# Patient Record
Sex: Male | Born: 1970
Health system: Southern US, Community
[De-identification: ages and names within clinical notes are randomized; demographics above are authoritative.]

## PROBLEM LIST (undated history)

## (undated) DIAGNOSIS — T7840XA Allergy, unspecified, initial encounter: Secondary | ICD-10-CM

## (undated) DIAGNOSIS — H9190 Unspecified hearing loss, unspecified ear: Secondary | ICD-10-CM

## (undated) HISTORY — DX: Allergy, unspecified, initial encounter: T78.40XA

## (undated) HISTORY — DX: Unspecified hearing loss, unspecified ear: H91.90

## (undated) HISTORY — PX: ROTATOR CUFF REPAIR: SHX139

---

## 2003-06-23 ENCOUNTER — Ambulatory Visit (HOSPITAL_BASED_OUTPATIENT_CLINIC_OR_DEPARTMENT_OTHER): Admission: RE | Admit: 2003-06-23 | Discharge: 2003-06-23 | Payer: Self-pay | Admitting: Orthopedic Surgery

## 2017-10-02 ENCOUNTER — Ambulatory Visit (INDEPENDENT_AMBULATORY_CARE_PROVIDER_SITE_OTHER): Payer: 59 | Admitting: Family Medicine

## 2017-10-02 ENCOUNTER — Encounter: Payer: Self-pay | Admitting: Family Medicine

## 2017-10-02 VITALS — BP 128/82 | HR 82 | Temp 97.2°F | Ht 72.0 in | Wt 206.0 lb

## 2017-10-02 DIAGNOSIS — R1030 Lower abdominal pain, unspecified: Secondary | ICD-10-CM

## 2017-10-02 LAB — MICROSCOPIC EXAMINATION
BACTERIA UA: NONE SEEN
EPITHELIAL CELLS (NON RENAL): NONE SEEN /HPF (ref 0–10)
RBC MICROSCOPIC, UA: NONE SEEN /HPF (ref 0–?)
Renal Epithel, UA: NONE SEEN /hpf

## 2017-10-02 LAB — URINALYSIS, COMPLETE
Bilirubin, UA: NEGATIVE
GLUCOSE, UA: NEGATIVE
KETONES UA: NEGATIVE
LEUKOCYTES UA: NEGATIVE
NITRITE UA: NEGATIVE
PROTEIN UA: NEGATIVE
RBC, UA: NEGATIVE
SPEC GRAV UA: 1.02 (ref 1.005–1.030)
Urobilinogen, Ur: 0.2 mg/dL (ref 0.2–1.0)
pH, UA: 6 (ref 5.0–7.5)

## 2017-10-02 NOTE — Progress Notes (Signed)
BP 128/82   Pulse 82   Temp (!) 97.2 F (36.2 C) (Oral)   Ht 6' (1.829 m)   Wt 206 lb (93.4 kg)   BMI 27.94 kg/m    Subjective:    Patient ID: Wesley Baxter, male    DOB: Jun 09, 1971, 47 y.o.   MRN: [id number]  HPI: Wesley Baxter is a 47 y.o. male presenting on 10/02/2017 for Establish Care (has not been to doctor in years; gets a "physical" here for his employer)   HPI Lower abdominal pain and groin pain Patient describes this lower abdominal pressure/pain.  He says he will get a sharp shooting pain down more towards his rectum.  And we will also get a tightness or pressure sensation in his scrotum.  He cannot pinpoint that one side is more than the other.  He denies any urinary or bowel issues or blood in his stool or urine.  He denies any burning with urination or pain with bowel movements.  He feels like the pressure sensation but will make him feel constipated but he has not been constipated and has bowel movements every day and does not have to strain or push to get them out.  He says that he feels this pain in brief moments that will arise when he is lifting something over 25 pounds and shifting to the side while he is lifting.  He says if he just lifts and walks straight ahead he does not feel it.  He does not feel it when he squats down or moves or does any other movements except lifting and stepping towards the side.  He feels like this is been going on for about 4 months and the most recent occurrence was a couple days ago.  Relevant past medical, surgical, family and social history reviewed and updated as indicated. Interim medical history since our last visit reviewed. Allergies and medications reviewed and updated.  Review of Systems  Constitutional: Negative for chills and fever.  Respiratory: Negative for shortness of breath and wheezing.   Cardiovascular: Negative for chest pain and leg swelling.  Gastrointestinal: Positive for abdominal pain. Negative for  abdominal distention, blood in stool, constipation, diarrhea, nausea and vomiting.  Genitourinary: Positive for testicular pain. Negative for difficulty urinating, discharge, flank pain, frequency, hematuria, penile pain, penile swelling and scrotal swelling.  Musculoskeletal: Positive for myalgias. Negative for back pain and gait problem.  Skin: Negative for color change and rash.  Neurological: Negative for dizziness, weakness, light-headedness and numbness.  All other systems reviewed and are negative.   Per HPI unless specifically indicated above  Social History   Socioeconomic History  . Marital status: Married    Spouse name: Not on file  . Number of children: Not on file  . Years of education: Not on file  . Highest education level: Not on file  Social Needs  . Financial resource strain: Not on file  . Food insecurity - worry: Not on file  . Food insecurity - inability: Not on file  . Transportation needs - medical: Not on file  . Transportation needs - non-medical: Not on file  Occupational History  . Not on file  Tobacco Use  . Smoking status: Never Smoker  . Smokeless tobacco: Never Used  Substance and Sexual Activity  . Alcohol use: No    Frequency: Never  . Drug use: No  . Sexual activity: Yes    Birth control/protection: Post-menopausal  Other Topics Concern  . Not on file  Social History Narrative  . Not on file    Past Surgical History:  Procedure Laterality Date  . ROTATOR CUFF REPAIR Right    in 1998 and again in 2003    Family History  Problem Relation Age of Onset  . Hyperlipidemia Mother   . Hearing loss Father   . Cancer Maternal Grandmother   . Cancer Maternal Grandfather   . Early death Maternal Grandfather 39  . Cancer Paternal Grandfather     Allergies as of 10/02/2017   No Known Allergies     Medication List        Accurate as of 10/02/17  8:58 AM. Always use your most recent med list.          omeprazole 20 MG  capsule Commonly known as:  PRILOSEC Take 20 mg by mouth every morning.          Objective:    BP 128/82   Pulse 82   Temp (!) 97.2 F (36.2 C) (Oral)   Ht 6' (1.829 m)   Wt 206 lb (93.4 kg)   BMI 27.94 kg/m   Wt Readings from Last 3 Encounters:  10/02/17 206 lb (93.4 kg)    Physical Exam  Constitutional: He is oriented to person, place, and time. He appears well-developed and well-nourished. No distress.  Eyes: Conjunctivae are normal. No scleral icterus.  Neck: Neck supple. No thyromegaly present.  Cardiovascular: Normal rate, regular rhythm, normal heart sounds and intact distal pulses.  No murmur heard. Pulmonary/Chest: Effort normal and breath sounds normal. No respiratory distress. He has no wheezes. He has no rales.  Abdominal: Soft. Bowel sounds are normal. He exhibits no distension. There is no hepatosplenomegaly. There is no tenderness. There is no rigidity, no rebound, no guarding and no CVA tenderness. Hernia confirmed negative in the right inguinal area and confirmed negative in the left inguinal area.  Genitourinary: Rectum normal, prostate normal, testes normal and penis normal. Cremasteric reflex is present. Right testis shows no mass, no swelling and no tenderness. Left testis shows no mass, no swelling and no tenderness. Circumcised.  Musculoskeletal: Normal range of motion. He exhibits no edema.  Lymphadenopathy:    He has no cervical adenopathy.       Right: No inguinal adenopathy present.       Left: No inguinal adenopathy present.  Neurological: He is alert and oriented to person, place, and time. Coordination normal.  Skin: Skin is warm and dry. No rash noted. He is not diaphoretic.  Psychiatric: He has a normal mood and affect. His behavior is normal.  Nursing note and vitals reviewed.   No results found for this or any previous visit.    Assessment & Plan:   Problem List Items Addressed This Visit    None    Visit Diagnoses    Groin pain,  unspecified laterality    -  Primary   Relevant Orders   Urinalysis, Complete   Urine Culture   Sedimentation rate   High sensitivity CRP   PSA, total and free   CMP14+EGFR   CK total and CKMB (cardiac)not at [facility]   Lower abdominal pain       Relevant Orders   Urinalysis, Complete   Urine Culture   Sedimentation rate   High sensitivity CRP   PSA, total and free   CMP14+EGFR   CK total and CKMB (cardiac)not at [facility]     On exam no tenderness noted for prostate exam or abdominal pain.  No scrotal pain or tenderness.  Will test for muscular and urine as well.  Likely a muscular strain that has been persisting.  Consider PT referral if blood work is normal  Follow up plan: Return if symptoms worsen or fail to improve.  Caryl Pina, MD [facility] 10/02/2017, 8:58 AM

## 2017-10-03 LAB — CMP14+EGFR
ALT: 16 IU/L (ref 0–44)
AST: 18 IU/L (ref 0–40)
Albumin/Globulin Ratio: 2.1 (ref 1.2–2.2)
Albumin: 4.6 g/dL (ref 3.5–5.5)
Alkaline Phosphatase: 61 IU/L (ref 39–117)
BUN / CREAT RATIO: 16 (ref 9–20)
BUN: 16 mg/dL (ref 6–24)
Bilirubin Total: 0.4 mg/dL (ref 0.0–1.2)
CALCIUM: 9.5 mg/dL (ref 8.7–10.2)
CO2: 26 mmol/L (ref 20–29)
CREATININE: 1.03 mg/dL (ref 0.76–1.27)
Chloride: 99 mmol/L (ref 96–106)
GFR, EST AFRICAN AMERICAN: 100 mL/min/{1.73_m2} (ref >59)
GFR, EST NON AFRICAN AMERICAN: 86 mL/min/{1.73_m2} (ref >59)
GLUCOSE: 89 mg/dL (ref 65–99)
Globulin, Total: 2.2 g/dL (ref 1.5–4.5)
Potassium: 4.3 mmol/L (ref 3.5–5.2)
Sodium: 141 mmol/L (ref 134–144)
TOTAL PROTEIN: 6.8 g/dL (ref 6.0–8.5)

## 2017-10-03 LAB — PSA, TOTAL AND FREE
PSA, Free Pct: 36.3 %
PSA, Free: 0.29 ng/mL
Prostate Specific Ag, Serum: 0.8 ng/mL (ref 0.0–4.0)

## 2017-10-03 LAB — HIGH SENSITIVITY CRP: CRP HIGH SENSITIVITY: 0.66 mg/L (ref 0.00–3.00)

## 2017-10-03 LAB — URINE CULTURE

## 2017-10-03 LAB — CK TOTAL AND CKMB (NOT AT ARMC)
CK-MB Index: 1.9 ng/mL (ref 0.0–10.4)
Total CK: 108 U/L (ref 24–204)

## 2017-10-03 LAB — SEDIMENTATION RATE: Sed Rate: 2 mm/hr (ref 0–15)

## 2017-11-27 ENCOUNTER — Encounter (HOSPITAL_COMMUNITY): Payer: Self-pay | Admitting: Emergency Medicine

## 2017-11-27 ENCOUNTER — Emergency Department (HOSPITAL_COMMUNITY): Payer: 59

## 2017-11-27 ENCOUNTER — Emergency Department (HOSPITAL_COMMUNITY)
Admission: EM | Admit: 2017-11-27 | Discharge: 2017-11-27 | Disposition: A | Discharge status: Home / Self Care | Payer: 59 | Attending: Emergency Medicine | Admitting: Emergency Medicine

## 2017-11-27 DIAGNOSIS — Z79899 Other long term (current) drug therapy: Secondary | ICD-10-CM | POA: Insufficient documentation

## 2017-11-27 DIAGNOSIS — R1011 Right upper quadrant pain: Secondary | ICD-10-CM | POA: Diagnosis not present

## 2017-11-27 DIAGNOSIS — N202 Calculus of kidney with calculus of ureter: Secondary | ICD-10-CM | POA: Diagnosis not present

## 2017-11-27 DIAGNOSIS — N200 Calculus of kidney: Secondary | ICD-10-CM | POA: Diagnosis not present

## 2017-11-27 LAB — URINALYSIS, ROUTINE W REFLEX MICROSCOPIC
Bilirubin Urine: NEGATIVE
Glucose, UA: NEGATIVE mg/dL
Ketones, ur: NEGATIVE mg/dL
Leukocytes, UA: NEGATIVE
Nitrite: NEGATIVE
Protein, ur: NEGATIVE mg/dL
SQUAMOUS EPITHELIAL / LPF: NONE SEEN
Specific Gravity, Urine: 1.011 (ref 1.005–1.030)
pH: 6 (ref 5.0–8.0)

## 2017-11-27 MED ORDER — OXYCODONE-ACETAMINOPHEN 5-325 MG PO TABS: 1.0000 | ORAL_TABLET | Freq: Four times a day (QID) | ORAL | 0 refills | Status: DC | PRN

## 2017-11-27 NOTE — ED Notes (Signed)
  Results reviewed.  No change in acuity at this time 

## 2017-11-27 NOTE — ED Triage Notes (Signed)
Patient presents to ED for assessment of right sided flank pain starting approx 1330 today while at work.  Patient denies any known injury.  Denies changes in urination.  States when pain was severe he experience n/v, bloating and light-headedness.

## 2017-11-27 NOTE — ED Notes (Signed)
Declined W/C at D/C and was escorted to lobby by RN. 

## 2017-11-27 NOTE — ED Provider Notes (Signed)
[facility] EMERGENCY DEPARTMENT Provider Note   CSN: [id number] Arrival date & time: 11/27/17  1405     History   Chief Complaint Chief Complaint  Patient presents with  . Flank Pain    HPI Wesley Baxter is a 47 y.o. male.  HPI   47 year old male presents today with right-sided flank pain.  Patient notes several hours prior to arrival he had acute onset of right sharp stabbing flank pain.  Patient reports he could not find a comfortable position, denies any trauma, denies any abdominal pain, dysuria, fever or chills.  Patient notes several episodes in the past very similar not as severe.  No history of kidney stones, he was in his usual state of health prior to the event.  Past Medical History:  Diagnosis Date  . Allergy   . Hearing loss     There are no active problems to display for this patient.   Past Surgical History:  Procedure Laterality Date  . ROTATOR CUFF REPAIR Right    in 1998 and again in 2003       Home Medications    Prior to Admission medications   Medication Sig Start Date End Date Taking? Authorizing Provider  omeprazole (PRILOSEC) 20 MG capsule Take 20 mg by mouth every morning.    [provider]  oxyCODONE-acetaminophen (PERCOCET/ROXICET) 5-325 MG tablet Take 1 tablet by mouth every 6 (six) hours as needed for severe pain. 11/27/17   Eyvonne Mechanic, PA-C    Family History Family History  Problem Relation Age of Onset  . Hyperlipidemia Mother   . Hearing loss Father   . Cancer Maternal Grandmother   . Cancer Maternal Grandfather   . Early death Maternal Grandfather 47  . Cancer Paternal Grandfather     Social History Social History   Tobacco Use  . Smoking status: Never Smoker  . Smokeless tobacco: Never Used  Substance Use Topics  . Alcohol use: No    Frequency: Never  . Drug use: No     Allergies   Patient has no known allergies.   Review of Systems Review of Systems  All other  systems reviewed and are negative.    Physical Exam Updated Vital Signs BP 135/85 (BP Location: Right Arm)   Pulse 66   Temp 97.8 F (36.6 C) (Oral)   Resp 18   SpO2 99%   Physical Exam  Constitutional: He is oriented to person, place, and time. He appears well-developed and well-nourished.  HENT:  Head: Normocephalic and atraumatic.  Eyes: Conjunctivae are normal. Pupils are equal, round, and reactive to light. Right eye exhibits no discharge. Left eye exhibits no discharge. No scleral icterus.  Neck: Normal range of motion. No JVD present. No tracheal deviation present.  Pulmonary/Chest: Effort normal. No stridor.  Abdominal: Soft. He exhibits no distension and no mass. There is no tenderness. There is no rebound and no guarding. No hernia.  No CVA tenderness  Neurological: He is alert and oriented to person, place, and time. Coordination normal.  Psychiatric: He has a normal mood and affect. His behavior is normal. Judgment and thought content normal.  Nursing note and vitals reviewed.    ED Treatments / Results  Labs (all labs ordered are listed, but only abnormal results are displayed) Labs Reviewed  URINALYSIS, ROUTINE W REFLEX MICROSCOPIC - Abnormal; Notable for the following components:      Result Value   Hgb urine dipstick MODERATE (*)    Bacteria, UA  RARE (*)    All other components within normal limits    EKG  EKG Interpretation None       Radiology Ct Renal Stone Study  Result Date: 11/27/2017 CLINICAL DATA:  Right-sided flank pain and nausea EXAM: CT ABDOMEN AND PELVIS WITHOUT CONTRAST TECHNIQUE: Multidetector CT imaging of the abdomen and pelvis was performed following the standard protocol without oral or IV contrast. COMPARISON:  None. FINDINGS: Lower chest: Lung bases are clear. Hepatobiliary: No focal liver lesions are evident on this noncontrast enhanced study. Gallbladder wall is not appreciably thickened. There is no biliary duct dilatation.  Pancreas: No pancreatic mass or inflammatory focus. Spleen: No splenic lesions are evident. Adrenals/Urinary Tract: Adrenals bilaterally appear normal. There is no evident renal mass or hydronephrosis on either side. There is a 1 mm nonobstructing calculus in the upper pole left kidney. There is no ureteral calculus on either side. Note that there is a 2 mm calculus in the posterior urinary bladder midline. Urinary bladder wall thickness is normal. Bladder is located in the midline. Stomach/Bowel: There is no appreciable bowel wall or mesenteric thickening. There are scattered sigmoid diverticula without diverticulitis. There is no bowel wall or mesenteric thickening. No evident bowel obstruction. No free air or portal venous air is evident. Vascular/Lymphatic: There is no abdominal aortic aneurysm. No vascular lesions are evident. There is no appreciable adenopathy in the abdomen or pelvis. Reproductive: Prostate and seminal vesicles are normal in size and contour. No pelvic mass. Other: Appendix appears normal. No abscess or ascites is evident in the abdomen or pelvis. There is a small ventral hernia containing only fat. Musculoskeletal: There are no blastic or lytic bone lesions. There is no intramuscular or abdominal wall lesion. IMPRESSION: 1. There is a 2 mm calculus in the midline of the urinary bladder posteriorly. Suspect recent calculus passage. No ureteral calculi are evident on either side. No hydronephrosis is currently evident. There is a 1 mm nonobstructing calculus in the upper pole left kidney. 2. No bowel obstruction. There are scattered sigmoid diverticula without diverticulitis. No abscess. Appendix appears normal. 3.  Small ventral hernia containing only fat. Electronically Signed   By: Bretta BangWilliam  Woodruff III M.D.   On: 11/27/2017 17:58    Procedures Procedures (including critical care time)  Medications Ordered in ED Medications - No data to display   Initial Impression / Assessment  and Plan / ED Course  I have reviewed the triage vital signs and the nursing notes.  Pertinent labs & imaging results that were available during my care of the patient were reviewed by me and considered in my medical decision making (see chart for details).      Final Clinical Impressions(s) / ED Diagnoses   Final diagnoses:  Kidney stone    Labs: Also has showing moderate hemoglobin  Imaging: CT renal showing nonobstructing stone in the urinary bladder, nonobstructing stone in the left kidney, ventral hernia with fat  Consults:  Therapeutics:  Discharge Meds: Percocet  Assessment/Plan: Asymptomatic patient presents today with likely passed kidney stone.  No signs of obstruction, no hydronephrosis.  Well-appearing no apparent distress.  Discharged home with pain medication and when symptoms come back, outpatient urology follow-up and strict return precautions.  Patient verbalized understanding and agreement to today's plan      ED Discharge Orders        Ordered    oxyCODONE-acetaminophen (PERCOCET/ROXICET) 5-325 MG tablet  Every 6 hours PRN     11/27/17 1905  Eyvonne Mechanic, PA-C 11/27/17 Izell Kapaa    Rolland Porter, MD 11/28/17 2224

## 2017-11-27 NOTE — Discharge Instructions (Signed)
Please read attached information. If you experience any new or worsening signs or symptoms please return to the emergency room for evaluation. Please follow-up with your primary care provider or specialist as discussed. Please use medication prescribed only as directed and discontinue taking if you have any concerning signs or symptoms.   °

## 2018-03-14 IMAGING — CT CT RENAL STONE PROTOCOL
2 of 5 series · 16 of 46 positions shown, 18 images · non-contrast
Comparison: None.

CLINICAL DATA: Right-sided flank pain and nausea

EXAM:
CT ABDOMEN AND PELVIS WITHOUT CONTRAST
TECHNIQUE: Multidetector CT imaging of the abdomen and pelvis was performed
following the standard protocol without oral or IV contrast.

[Series 4: stone study 5.0 i30f 2 · axial · 0.80mm/px · z∈[+811,+1256]mm · 13 of 99 slices shown, 15 images]
[im 5/99  soft-tissue]
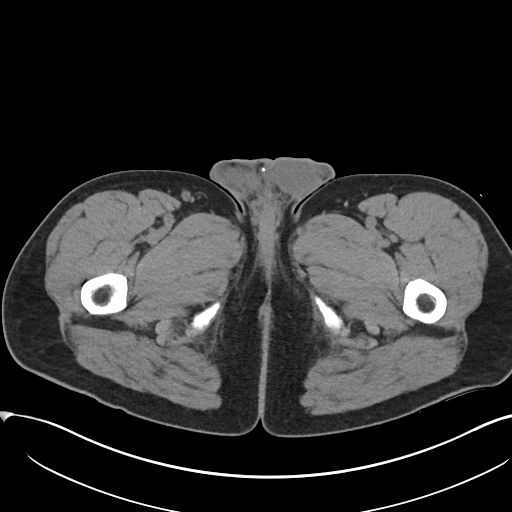
[im 5/99  bone]
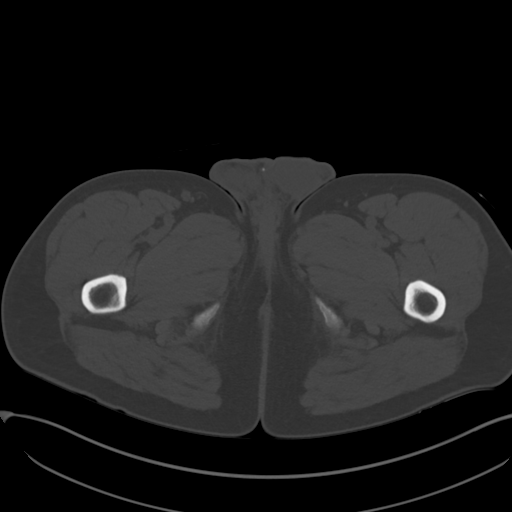
[im 13/99  soft-tissue]
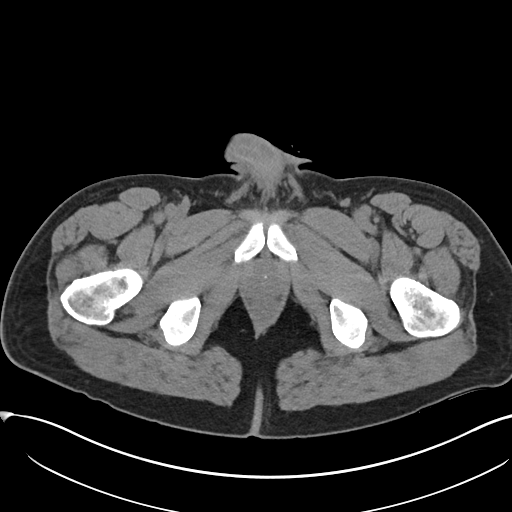
[im 21/99  soft-tissue]
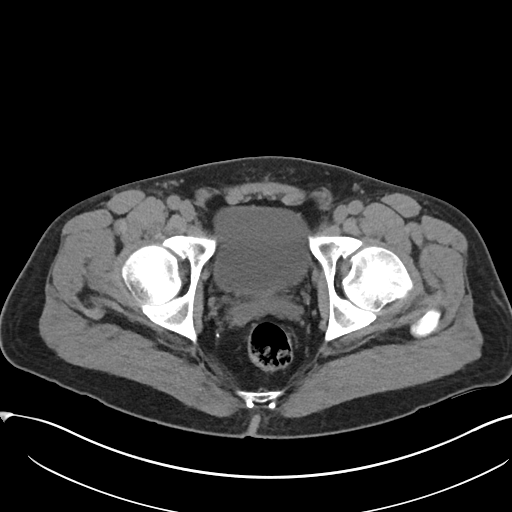
[im 29/99  soft-tissue]
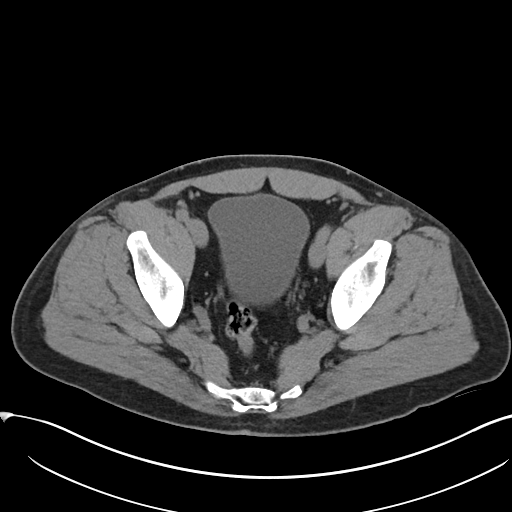
[im 33/99  soft-tissue]
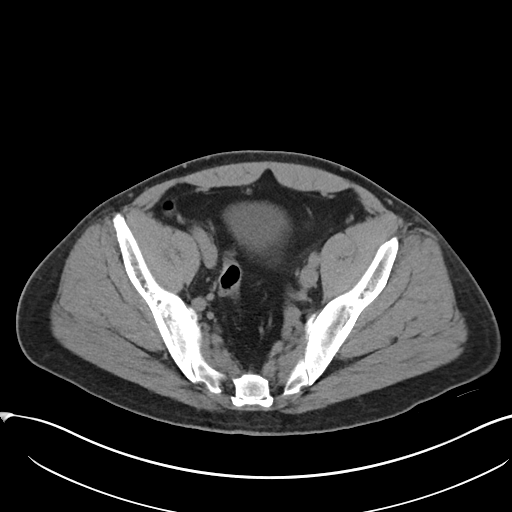
[im 41/99  soft-tissue]
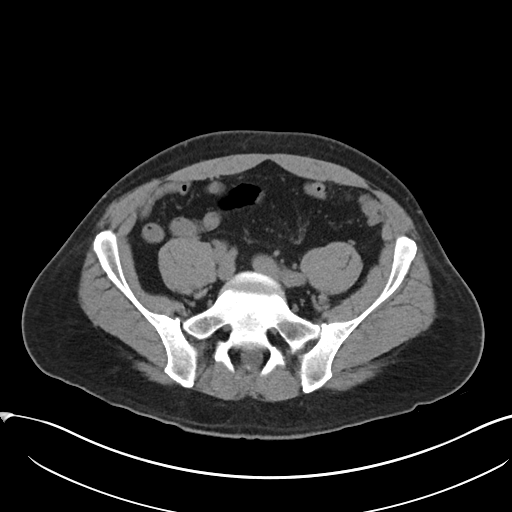
[im 50/99  soft-tissue]
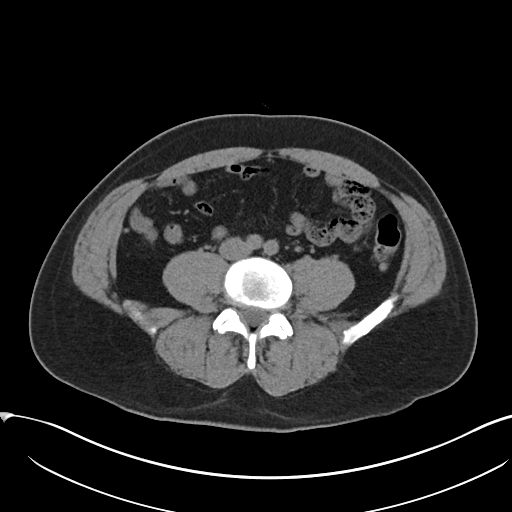
[im 58/99  soft-tissue]
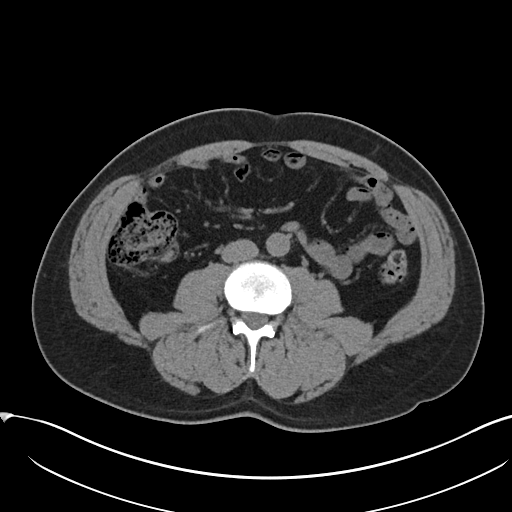
[im 66/99  soft-tissue]
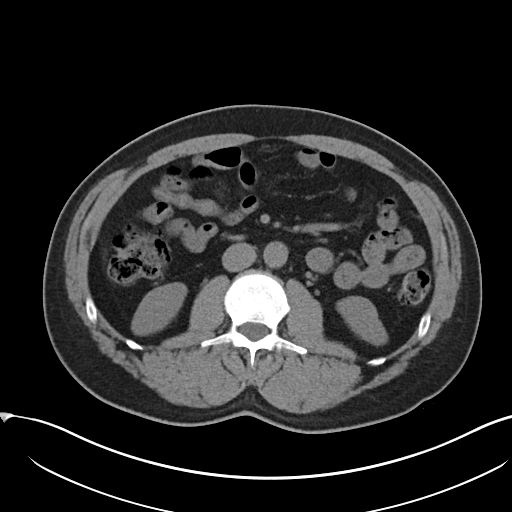
[im 66/99  bone]
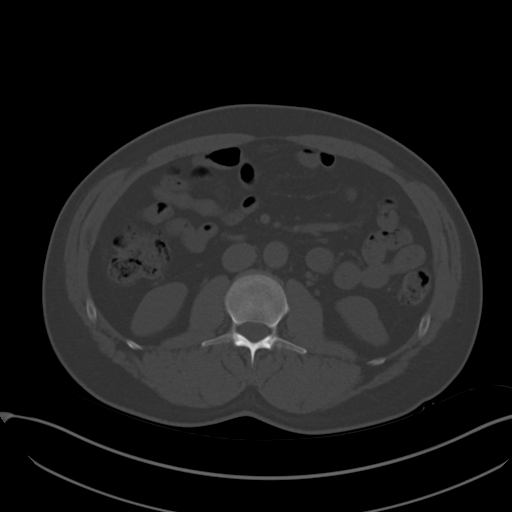
[im 70/99  soft-tissue]
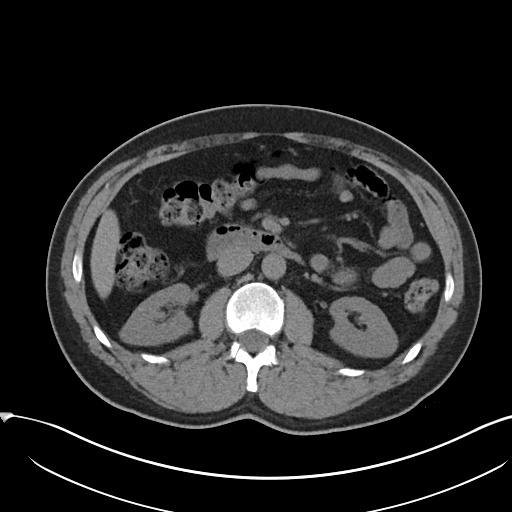
[im 78/99  soft-tissue]
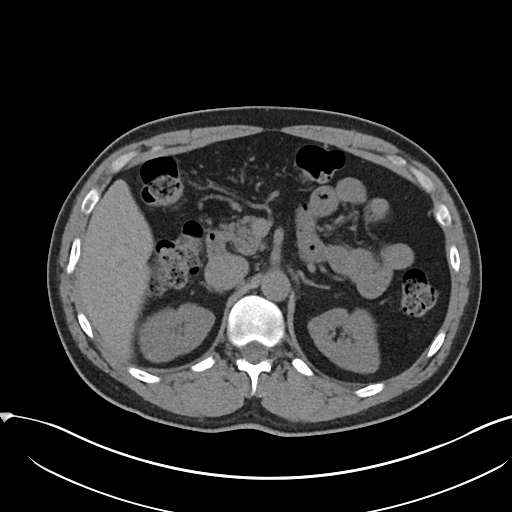
[im 86/99  soft-tissue]
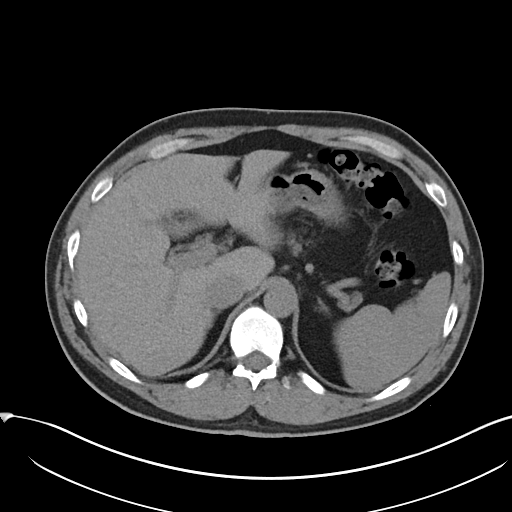
[im 94/99  soft-tissue]
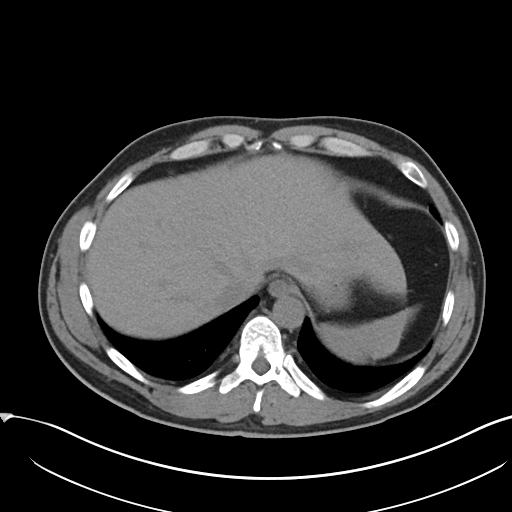

[Series 7: coronal soft tissue · coronal · 0.79mm/px · 3 of 93 slices shown]
[im 31/93  soft-tissue]
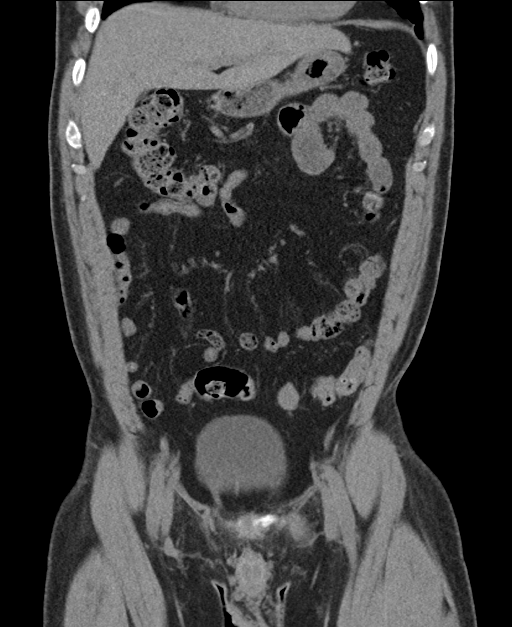
[im 41/93  soft-tissue]
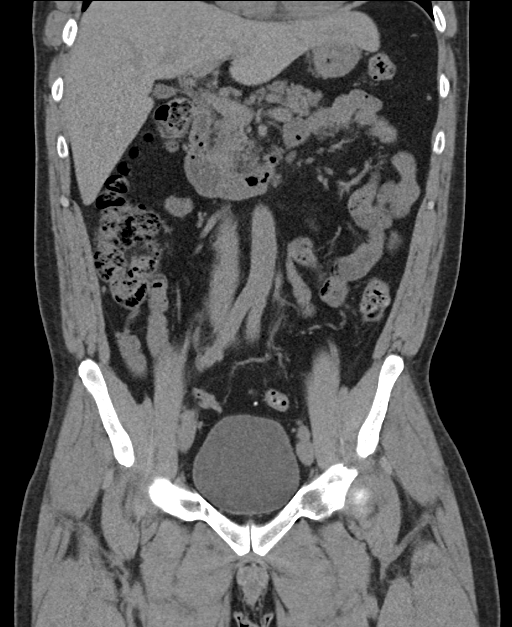
[im 52/93  soft-tissue]
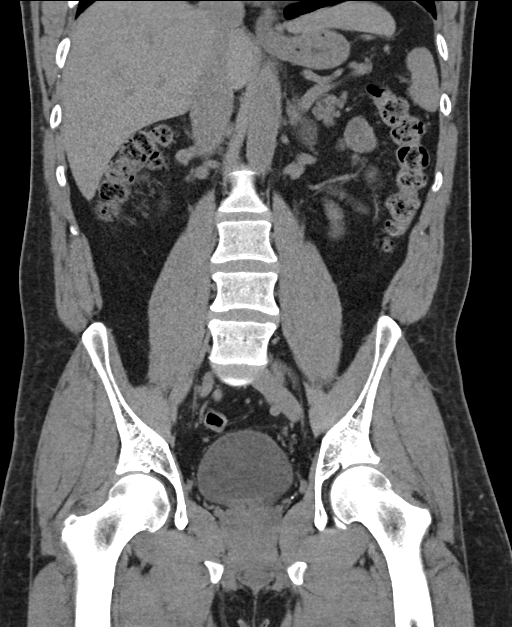

[16 of 46 positions shown; findings below may reference images not displayed]

FINDINGS: Lower chest: Lung bases are clear.

Hepatobiliary: No focal liver lesions are evident on this
noncontrast enhanced study. Gallbladder wall is not appreciably
thickened. There is no biliary duct dilatation.

Pancreas: No pancreatic mass or inflammatory focus.

Spleen: No splenic lesions are evident.

Adrenals/Urinary Tract: Adrenals bilaterally appear normal. There is
no evident renal mass or hydronephrosis on either side. There is a 1
mm nonobstructing calculus in the upper pole left kidney. There is
no ureteral calculus on either side. Note that there is a 2 mm
calculus in the posterior urinary bladder midline. Urinary bladder
wall thickness is normal. Bladder is located in the midline.

Stomach/Bowel: There is no appreciable bowel wall or mesenteric
thickening. There are scattered sigmoid diverticula without
diverticulitis. There is no bowel wall or mesenteric thickening. No
evident bowel obstruction. No free air or portal venous air is
evident.

Vascular/Lymphatic: There is no abdominal aortic aneurysm. No
vascular lesions are evident. There is no appreciable adenopathy in
the abdomen or pelvis.

Reproductive: Prostate and seminal vesicles are normal in size and
contour. No pelvic mass.

Other: Appendix appears normal. No abscess or ascites is evident in
the abdomen or pelvis. There is a small ventral hernia containing
only fat.

Musculoskeletal: There are no blastic or lytic bone lesions. There
is no intramuscular or abdominal wall lesion.
IMPRESSION: 1. There is a 2 mm calculus in the midline of the urinary bladder
posteriorly. Suspect recent calculus passage. No ureteral calculi
are evident on either side. No hydronephrosis is currently evident.
There is a 1 mm nonobstructing calculus in the upper pole left
kidney.

2. No bowel obstruction. There are scattered sigmoid diverticula
without diverticulitis. No abscess. Appendix appears normal.

3.  Small ventral hernia containing only fat.

## 2018-08-26 DIAGNOSIS — G8929 Other chronic pain: Secondary | ICD-10-CM | POA: Diagnosis not present

## 2018-08-26 DIAGNOSIS — M19012 Primary osteoarthritis, left shoulder: Secondary | ICD-10-CM | POA: Diagnosis not present

## 2018-08-26 DIAGNOSIS — M25512 Pain in left shoulder: Secondary | ICD-10-CM | POA: Diagnosis not present

## 2018-10-15 DIAGNOSIS — R52 Pain, unspecified: Secondary | ICD-10-CM | POA: Diagnosis not present

## 2018-10-15 DIAGNOSIS — K529 Noninfective gastroenteritis and colitis, unspecified: Secondary | ICD-10-CM | POA: Diagnosis not present

## 2018-10-15 DIAGNOSIS — R112 Nausea with vomiting, unspecified: Secondary | ICD-10-CM | POA: Diagnosis not present

## 2019-02-09 DIAGNOSIS — M19012 Primary osteoarthritis, left shoulder: Secondary | ICD-10-CM | POA: Diagnosis not present

## 2019-04-08 ENCOUNTER — Encounter: Payer: Self-pay | Admitting: Family Medicine

## 2019-04-08 ENCOUNTER — Other Ambulatory Visit: Payer: Self-pay

## 2019-04-08 ENCOUNTER — Ambulatory Visit (INDEPENDENT_AMBULATORY_CARE_PROVIDER_SITE_OTHER): Payer: 59 | Admitting: Family Medicine

## 2019-04-08 VITALS — BP 101/70 | HR 68 | Temp 97.6°F | Ht 71.5 in | Wt 184.0 lb

## 2019-04-08 DIAGNOSIS — Z0000 Encounter for general adult medical examination without abnormal findings: Secondary | ICD-10-CM | POA: Diagnosis not present

## 2019-04-08 DIAGNOSIS — Z23 Encounter for immunization: Secondary | ICD-10-CM | POA: Diagnosis not present

## 2019-04-08 NOTE — Progress Notes (Signed)
BP 101/70   Pulse 68   Temp 97.6 F (36.4 C) (Oral)   Ht 5' 11.5" (1.816 m)   Wt 184 lb (83.5 kg)   BMI 25.31 kg/m    Subjective:   Patient ID: Wesley Baxter, male    DOB: January 15, 1971, 48 y.o.   MRN: [id number]  HPI: Wesley Baxter is a 48 y.o. male presenting on 04/08/2019 for Annual Exam   HPI Well adult exam and physical Patient is coming in for a physical exam and checkup and well adult exam.  Patient denies any major health problems and takes occasional omeprazole but says otherwise he is healthy and denies any major health issues or health concerns.  Patient denies any chest pain, shortness of breath, headaches or vision issues, abdominal complaints, diarrhea, nausea, vomiting, or joint issues.   Relevant past medical, surgical, family and social history reviewed and updated as indicated. Interim medical history since our last visit reviewed. Allergies and medications reviewed and updated.  Review of Systems  Constitutional: Negative for chills and fever.  Eyes: Negative for discharge.  Respiratory: Negative for shortness of breath and wheezing.   Cardiovascular: Negative for chest pain and leg swelling.  Musculoskeletal: Negative for back pain and gait problem.  Skin: Negative for rash.  Neurological: Negative for dizziness, weakness and light-headedness.  All other systems reviewed and are negative.   Per HPI unless specifically indicated above   Allergies as of 04/08/2019   No Known Allergies     Medication List       Accurate as of April 08, 2019  3:31 PM. If you have any questions, ask your nurse or doctor.        STOP taking these medications   oxyCODONE-acetaminophen 5-325 MG tablet Commonly known as: PERCOCET/ROXICET Stopped by: Fransisca Kaufmann , MD     TAKE these medications   omeprazole 20 MG capsule Commonly known as: PRILOSEC Take 20 mg by mouth every morning.        Objective:   BP 101/70   Pulse 68   Temp 97.6 F (36.4  C) (Oral)   Ht 5' 11.5" (1.816 m)   Wt 184 lb (83.5 kg)   BMI 25.31 kg/m   Wt Readings from Last 3 Encounters:  04/08/19 184 lb (83.5 kg)  10/02/17 206 lb (93.4 kg)    Physical Exam Vitals signs and nursing note reviewed.  Constitutional:      General: He is not in acute distress.    Appearance: He is well-developed. He is not diaphoretic.  HENT:     Right Ear: External ear normal.     Left Ear: External ear normal.     Nose: Nose normal.     Mouth/Throat:     Pharynx: No oropharyngeal exudate.  Eyes:     General: No scleral icterus.       Right eye: No discharge.     Conjunctiva/sclera: Conjunctivae normal.     Pupils: Pupils are equal, round, and reactive to light.  Neck:     Musculoskeletal: Neck supple.     Thyroid: No thyromegaly.  Cardiovascular:     Rate and Rhythm: Normal rate and regular rhythm.     Heart sounds: Normal heart sounds. No murmur.  Pulmonary:     Effort: Pulmonary effort is normal. No respiratory distress.     Breath sounds: Normal breath sounds. No wheezing.  Abdominal:     General: Bowel sounds are normal. There is no distension.  Palpations: Abdomen is soft.     Tenderness: There is no abdominal tenderness. There is no guarding or rebound.  Genitourinary:    Comments: Patient declined exam Musculoskeletal: Normal range of motion.  Lymphadenopathy:     Cervical: No cervical adenopathy.  Skin:    General: Skin is warm and dry.     Findings: No rash.  Neurological:     Mental Status: He is alert and oriented to person, place, and time.     Coordination: Coordination normal.  Psychiatric:        Behavior: Behavior normal.       Assessment & Plan:   Problem List Items Addressed This Visit    None    Visit Diagnoses    Well adult exam    -  Primary   Relevant Orders   CBC with Differential/Platelet (Completed)   CMP14+EGFR (Completed)   Lipid panel (Completed)       Follow up plan: Return in about 1 year (around 04/07/2020),  or if symptoms worsen or fail to improve.  Counseling provided for all of the vaccine components Orders Placed This Encounter  Procedures  . CBC with Differential/Platelet  . CMP14+EGFR  . Lipid panel    Caryl Pina, MD [facility] 04/08/2019, 3:31 PM

## 2019-04-09 LAB — CBC WITH DIFFERENTIAL/PLATELET
Basophils Absolute: 0.1 10*3/uL (ref 0.0–0.2)
Basos: 1 %
EOS (ABSOLUTE): 0.4 10*3/uL (ref 0.0–0.4)
Eos: 6 %
Hematocrit: 46 % (ref 37.5–51.0)
Hemoglobin: 15.4 g/dL (ref 13.0–17.7)
Immature Grans (Abs): 0 10*3/uL (ref 0.0–0.1)
Immature Granulocytes: 0 %
Lymphocytes Absolute: 1.8 10*3/uL (ref 0.7–3.1)
Lymphs: 28 %
MCH: 29.6 pg (ref 26.6–33.0)
MCHC: 33.5 g/dL (ref 31.5–35.7)
MCV: 89 fL (ref 79–97)
Monocytes Absolute: 0.5 10*3/uL (ref 0.1–0.9)
Monocytes: 8 %
Neutrophils Absolute: 3.6 10*3/uL (ref 1.4–7.0)
Neutrophils: 57 %
Platelets: 245 10*3/uL (ref 150–450)
RBC: 5.2 x10E6/uL (ref 4.14–5.80)
RDW: 13.3 % (ref 11.6–15.4)
WBC: 6.4 10*3/uL (ref 3.4–10.8)

## 2019-04-09 LAB — CMP14+EGFR
ALT: 12 IU/L (ref 0–44)
AST: 20 IU/L (ref 0–40)
Albumin/Globulin Ratio: 2 (ref 1.2–2.2)
Albumin: 4.5 g/dL (ref 4.0–5.0)
Alkaline Phosphatase: 54 IU/L (ref 39–117)
BUN/Creatinine Ratio: 17 (ref 9–20)
BUN: 17 mg/dL (ref 6–24)
Bilirubin Total: 0.5 mg/dL (ref 0.0–1.2)
CO2: 28 mmol/L (ref 20–29)
Calcium: 9.7 mg/dL (ref 8.7–10.2)
Chloride: 99 mmol/L (ref 96–106)
Creatinine, Ser: 0.98 mg/dL (ref 0.76–1.27)
GFR calc Af Amer: 105 mL/min/{1.73_m2} (ref >59)
GFR calc non Af Amer: 91 mL/min/{1.73_m2} (ref >59)
Globulin, Total: 2.2 g/dL (ref 1.5–4.5)
Glucose: 95 mg/dL (ref 65–99)
Potassium: 4.2 mmol/L (ref 3.5–5.2)
Sodium: 138 mmol/L (ref 134–144)
Total Protein: 6.7 g/dL (ref 6.0–8.5)

## 2019-04-09 LAB — LIPID PANEL
Chol/HDL Ratio: 3.6 ratio (ref 0.0–5.0)
Cholesterol, Total: 162 mg/dL (ref 100–199)
HDL: 45 mg/dL (ref >39)
LDL Calculated: 84 mg/dL (ref 0–99)
Triglycerides: 164 mg/dL — ABNORMAL HIGH (ref 0–149)
VLDL Cholesterol Cal: 33 mg/dL (ref 5–40)

## 2019-04-10 ENCOUNTER — Telehealth: Payer: Self-pay | Admitting: Family Medicine

## 2019-04-10 ENCOUNTER — Encounter: Payer: Self-pay | Admitting: *Deleted

## 2019-04-10 NOTE — Telephone Encounter (Signed)
Called patient back with N/A.

## 2019-04-17 NOTE — Telephone Encounter (Signed)
Letter sent with lab results.  

## 2019-04-27 ENCOUNTER — Telehealth: Payer: Self-pay | Admitting: Family Medicine

## 2019-04-27 NOTE — Telephone Encounter (Signed)
Patient aware the form has been faxed. States they have not received it so patient is going to bring by another form to fill out.

## 2019-09-09 ENCOUNTER — Ambulatory Visit (INDEPENDENT_AMBULATORY_CARE_PROVIDER_SITE_OTHER): Payer: 59 | Admitting: Family Medicine

## 2019-09-09 ENCOUNTER — Encounter: Payer: Self-pay | Admitting: Family Medicine

## 2019-09-09 DIAGNOSIS — U071 COVID-19: Secondary | ICD-10-CM | POA: Diagnosis not present

## 2019-09-09 NOTE — Progress Notes (Signed)
   Virtual Visit via telephone Note  I connected with Wesley Baxter on 09/09/19 at 1537 by telephone and verified that I am speaking with the correct person using two identifiers. Wesley Baxter is currently located at home and no other people are currently with her during visit. The provider, Fransisca Kaufmann Lakeitha Basques, MD is located in their office at time of visit.  Call ended at 1545  I discussed the limitations, risks, security and privacy concerns of performing an evaluation and management service by telephone and the availability of in person appointments. I also discussed with the patient that there may be a patient responsible charge related to this service. The patient expressed understanding and agreed to proceed.   History and Present Illness: Patient is calling in for follow up on covid that started on 26th of November and was positive on 27th and fever until the 28th and has plenty fatigue.  He was supposed to go back to work but had not because he had further.  He would like to be out tomorrow for recovery until 09/14/2019.  He is feels a lot better and energy is returning.   No diagnosis found.  Outpatient Encounter Medications as of 09/09/2019  Medication Sig  . omeprazole (PRILOSEC) 20 MG capsule Take 20 mg by mouth every morning.   No facility-administered encounter medications on file as of 09/09/2019.     Review of Systems  Constitutional: Negative for chills and fever.  HENT: Positive for congestion, sneezing and sore throat. Negative for ear discharge, ear pain, postnasal drip, rhinorrhea, sinus pressure and voice change.   Eyes: Negative for pain, discharge, redness and visual disturbance.  Respiratory: Positive for cough. Negative for shortness of breath and wheezing.   Cardiovascular: Negative for chest pain and leg swelling.  Gastrointestinal: Positive for diarrhea. Negative for abdominal pain, nausea and vomiting.  Musculoskeletal: Negative for gait  problem.  Skin: Negative for rash.  All other systems reviewed and are negative.   Observations/Objective: Patient sounds comfortable and in no acute distress  Assessment and Plan: Problem List Items Addressed This Visit    None    Visit Diagnoses    COVID-19    -  Primary       Follow Up Instructions:  Will send fmla and be out through the 12/13 and go back the 12/14 diagnosis covid. Starting 11/26.    I discussed the assessment and treatment plan with the patient. The patient was provided an opportunity to ask questions and all were answered. The patient agreed with the plan and demonstrated an understanding of the instructions.   The patient was advised to call back or seek an in-person evaluation if the symptoms worsen or if the condition fails to improve as anticipated.  The above assessment and management plan was discussed with the patient. The patient verbalized understanding of and has agreed to the management plan. Patient is aware to call the clinic if symptoms persist or worsen. Patient is aware when to return to the clinic for a follow-up visit. Patient educated on when it is appropriate to go to the emergency department.    I provided 8 minutes of non-face-to-face time during this encounter.    Worthy Rancher, MD

## 2019-09-17 ENCOUNTER — Telehealth: Payer: Self-pay | Admitting: Family Medicine

## 2019-09-17 NOTE — Telephone Encounter (Signed)
Pt will contact company to resend forms

## 2019-09-17 NOTE — Telephone Encounter (Signed)
Patient wants to know if we sent over return to work FMLA forms to [employer] lady from [employer] says they have not received them yet.

## 2020-04-20 ENCOUNTER — Ambulatory Visit (INDEPENDENT_AMBULATORY_CARE_PROVIDER_SITE_OTHER): Payer: 59 | Admitting: Family Medicine

## 2020-04-20 ENCOUNTER — Other Ambulatory Visit: Payer: Self-pay

## 2020-04-20 ENCOUNTER — Encounter: Payer: Self-pay | Admitting: Family Medicine

## 2020-04-20 VITALS — BP 104/73 | HR 85 | Temp 98.2°F | Ht 71.5 in | Wt 193.0 lb

## 2020-04-20 DIAGNOSIS — M19212 Secondary osteoarthritis, left shoulder: Secondary | ICD-10-CM | POA: Diagnosis not present

## 2020-04-20 DIAGNOSIS — Z0001 Encounter for general adult medical examination with abnormal findings: Secondary | ICD-10-CM

## 2020-04-20 DIAGNOSIS — Z125 Encounter for screening for malignant neoplasm of prostate: Secondary | ICD-10-CM

## 2020-04-20 DIAGNOSIS — Z0000 Encounter for general adult medical examination without abnormal findings: Secondary | ICD-10-CM

## 2020-04-20 MED ORDER — METHYLPREDNISOLONE ACETATE 80 MG/ML IJ SUSP
80.0000 mg | Freq: Once | INTRAMUSCULAR | Status: AC
  Administered 2020-04-20: 15:00:00 80 mg via INTRAMUSCULAR

## 2020-04-20 NOTE — Progress Notes (Signed)
BP 104/73   Pulse 85   Temp 98.2 F (36.8 C)   Ht 5' 11.5" (1.816 m)   Wt 193 lb (87.5 kg)   SpO2 98%   BMI 26.54 kg/m    Subjective:   Patient ID: Wesley Baxter, male    DOB: [birth year], 49 y.o.   MRN: [id number]  HPI: Wesley Baxter is a 49 y.o. male presenting on 04/20/2020 for Medical Management of Chronic Issues (CPE)   HPI Adult well exam and physical Patient is coming in today for adult well exam and physical and he says the only real issue is been having is that his rotator cuff has been causing him problems.  He says this is been bothering him on his left shoulder for some time and he has developed arthritis and he saw an orthopedic and he basically said the only thing he could do was a left shoulder replacement.  He has done injections although it has been a year and a half because of Covid since he has been able to get in and get an injection.  Relevant past medical, surgical, family and social history reviewed and updated as indicated. Interim medical history since our last visit reviewed. Allergies and medications reviewed and updated.  Review of Systems  Constitutional: Negative for chills and fever.  HENT: Negative for ear pain and tinnitus.   Eyes: Negative for pain.  Respiratory: Negative for cough, shortness of breath and wheezing.   Cardiovascular: Negative for chest pain, palpitations and leg swelling.  Gastrointestinal: Negative for abdominal pain, blood in stool, constipation and diarrhea.  Genitourinary: Negative for dysuria and hematuria.  Musculoskeletal: Positive for arthralgias. Negative for back pain, gait problem and myalgias.  Skin: Negative for rash.  Neurological: Negative for dizziness, weakness and headaches.  Psychiatric/Behavioral: Negative for suicidal ideas.  All other systems reviewed and are negative.   Per HPI unless specifically indicated above   Allergies as of 04/20/2020   No Known Allergies     Medication List        Accurate as of April 20, 2020  3:01 PM. If you have any questions, ask your nurse or doctor.        omeprazole 20 MG capsule Commonly known as: PRILOSEC Take 20 mg by mouth every morning.        Objective:   BP 104/73   Pulse 85   Temp 98.2 F (36.8 C)   Ht 5' 11.5" (1.816 m)   Wt 193 lb (87.5 kg)   SpO2 98%   BMI 26.54 kg/m   Wt Readings from Last 3 Encounters:  04/20/20 193 lb (87.5 kg)  04/08/19 184 lb (83.5 kg)  10/02/17 206 lb (93.4 kg)    Physical Exam Vitals reviewed.  Constitutional:      General: He is not in acute distress.    Appearance: He is well-developed. He is not diaphoretic.  HENT:     Right Ear: External ear normal.     Left Ear: External ear normal.     Nose: Nose normal.     Mouth/Throat:     Pharynx: No oropharyngeal exudate.  Eyes:     General: No scleral icterus.       Right eye: No discharge.     Conjunctiva/sclera: Conjunctivae normal.     Pupils: Pupils are equal, round, and reactive to light.  Neck:     Thyroid: No thyromegaly.  Cardiovascular:     Rate and Rhythm: Normal rate and regular  rhythm.     Heart sounds: Normal heart sounds. No murmur heard.   Pulmonary:     Effort: Pulmonary effort is normal. No respiratory distress.     Breath sounds: Normal breath sounds. No wheezing.  Abdominal:     General: Bowel sounds are normal. There is no distension.     Palpations: Abdomen is soft.     Tenderness: There is no abdominal tenderness. There is no guarding or rebound.  Musculoskeletal:        General: Tenderness (Pain in left shoulder with overhead range of motion) present. Normal range of motion.     Cervical back: Neck supple.  Lymphadenopathy:     Cervical: No cervical adenopathy.  Skin:    General: Skin is warm and dry.     Findings: No rash.  Neurological:     Mental Status: He is alert and oriented to person, place, and time.     Coordination: Coordination normal.  Psychiatric:        Behavior: Behavior normal.       Left shoulder subacromial injection: Consent form signed. Risk factors of bleeding and infection discussed with patient and patient is agreeable towards injection. Patient prepped with Betadine. Lateral approach towards injection used. Injected 80 mg of Depo-Medrol and 1 mL of 2% lidocaine. Patient tolerated procedure well and no side effects from noted. Minimal to no bleeding. Simple bandage applied after.  Assessment & Plan:   Problem List Items Addressed This Visit    None    Visit Diagnoses    Well adult exam    -  Primary   Relevant Orders   CBC with Differential/Platelet   CMP14+EGFR   Lipid panel   PSA, total and free   Secondary osteoarthritis of left shoulder due to rotator cuff arthropathy       Relevant Medications   methylPREDNISolone acetate (DEPO-MEDROL) injection 80 mg (Start on 04/20/2020  3:30 PM)   Prostate cancer screening       Relevant Orders   PSA, total and free    Patient seems to be doing well healthwise, will check blood work and do the injection, follow-up in 1 year.  Follow up plan: Return in about 1 year (around 04/20/2021), or if symptoms worsen or fail to improve.  Counseling provided for all of the vaccine components No orders of the defined types were placed in this encounter.   Caryl Pina, MD [facility] 04/20/2020, 3:01 PM

## 2020-04-21 LAB — CBC WITH DIFFERENTIAL/PLATELET
Basophils Absolute: 0.1 10*3/uL (ref 0.0–0.2)
Basos: 1 %
EOS (ABSOLUTE): 0.4 10*3/uL (ref 0.0–0.4)
Eos: 6 %
Hematocrit: 46.6 % (ref 37.5–51.0)
Hemoglobin: 15.5 g/dL (ref 13.0–17.7)
Immature Grans (Abs): 0 10*3/uL (ref 0.0–0.1)
Immature Granulocytes: 0 %
Lymphocytes Absolute: 1.8 10*3/uL (ref 0.7–3.1)
Lymphs: 24 %
MCH: 29.9 pg (ref 26.6–33.0)
MCHC: 33.3 g/dL (ref 31.5–35.7)
MCV: 90 fL (ref 79–97)
Monocytes Absolute: 0.5 10*3/uL (ref 0.1–0.9)
Monocytes: 7 %
Neutrophils Absolute: 4.6 10*3/uL (ref 1.4–7.0)
Neutrophils: 62 %
Platelets: 245 10*3/uL (ref 150–450)
RBC: 5.18 x10E6/uL (ref 4.14–5.80)
RDW: 13.7 % (ref 11.6–15.4)
WBC: 7.3 10*3/uL (ref 3.4–10.8)

## 2020-04-21 LAB — CMP14+EGFR
ALT: 11 IU/L (ref 0–44)
AST: 16 IU/L (ref 0–40)
Albumin/Globulin Ratio: 1.7 (ref 1.2–2.2)
Albumin: 4.3 g/dL (ref 4.0–5.0)
Alkaline Phosphatase: 59 IU/L (ref 48–121)
BUN/Creatinine Ratio: 17 (ref 9–20)
BUN: 18 mg/dL (ref 6–24)
Bilirubin Total: 0.5 mg/dL (ref 0.0–1.2)
CO2: 27 mmol/L (ref 20–29)
Calcium: 9.3 mg/dL (ref 8.7–10.2)
Chloride: 99 mmol/L (ref 96–106)
Creatinine, Ser: 1.08 mg/dL (ref 0.76–1.27)
GFR calc Af Amer: 93 mL/min/{1.73_m2} (ref >59)
GFR calc non Af Amer: 80 mL/min/{1.73_m2} (ref >59)
Globulin, Total: 2.5 g/dL (ref 1.5–4.5)
Glucose: 91 mg/dL (ref 65–99)
Potassium: 4.3 mmol/L (ref 3.5–5.2)
Sodium: 138 mmol/L (ref 134–144)
Total Protein: 6.8 g/dL (ref 6.0–8.5)

## 2020-04-21 LAB — PSA, TOTAL AND FREE
PSA, Free Pct: 37.5 %
PSA, Free: 0.3 ng/mL
Prostate Specific Ag, Serum: 0.8 ng/mL (ref 0.0–4.0)

## 2020-04-21 LAB — LIPID PANEL
Chol/HDL Ratio: 3.8 ratio (ref 0.0–5.0)
Cholesterol, Total: 167 mg/dL (ref 100–199)
HDL: 44 mg/dL (ref >39)
LDL Chol Calc (NIH): 92 mg/dL (ref 0–99)
Triglycerides: 181 mg/dL — ABNORMAL HIGH (ref 0–149)
VLDL Cholesterol Cal: 31 mg/dL (ref 5–40)

## 2020-04-25 ENCOUNTER — Telehealth: Payer: Self-pay | Admitting: Family Medicine

## 2020-04-25 NOTE — Telephone Encounter (Signed)
Pt aware of results and he asked about a form that he gave to Dr Dettinger at the time of his visit. Will route to Dr Oris Drone nurse to see if she has the form.

## 2020-04-26 NOTE — Telephone Encounter (Signed)
Dr. Louanne Skye,  Have you seen paperwork for this pt?

## 2020-05-02 NOTE — Telephone Encounter (Signed)
I am pretty sure we took care of this before he left on vacation, he was seen that Wednesday which was 721 and I filled out the results on 722 and they were going to fax it that day.  It should have been faxed already.

## 2020-05-02 NOTE — Telephone Encounter (Signed)
The form has been completed, faxed, pt informed.

## 2020-12-30 ENCOUNTER — Telehealth: Payer: Self-pay

## 2021-03-01 ENCOUNTER — Ambulatory Visit (INDEPENDENT_AMBULATORY_CARE_PROVIDER_SITE_OTHER): Payer: 59 | Admitting: Family Medicine

## 2021-03-01 ENCOUNTER — Other Ambulatory Visit: Payer: Self-pay

## 2021-03-01 ENCOUNTER — Encounter: Payer: Self-pay | Admitting: Family Medicine

## 2021-03-01 VITALS — BP 118/81 | HR 76 | Temp 98.1°F | Ht 71.5 in | Wt 195.4 lb

## 2021-03-01 DIAGNOSIS — Z23 Encounter for immunization: Secondary | ICD-10-CM | POA: Diagnosis not present

## 2021-03-01 DIAGNOSIS — Z01818 Encounter for other preprocedural examination: Secondary | ICD-10-CM | POA: Diagnosis not present

## 2021-03-01 NOTE — Progress Notes (Signed)
BP 118/81   Pulse 76   Temp 98.1 F (36.7 C)   Ht 5' 11.5" (1.816 m)   Wt 195 lb 6.4 oz (88.6 kg)   SpO2 96%   BMI 26.87 kg/m    Subjective:   Patient ID: Wesley Baxter, male    DOB: Nov 14, 1970, 50 y.o.   MRN: [id number]  HPI: Wesley Baxter is a 50 y.o. male presenting on 03/01/2021 for Pre-op Exam   HPI Preoperative physical for left total shoulder revision Patient is going in for left total shoulder revision and is coming in for preoperative assessment.  They were going to do General and interscalene block for anesthesia.  Patient does not have any difficulties walking upstairs or walking a few blocks.  He does not get short of breath and is able to walk briskly and has a very good met score.  Relevant past medical, surgical, family and social history reviewed and updated as indicated. Interim medical history since our last visit reviewed. Allergies and medications reviewed and updated.  Review of Systems  Constitutional: Negative for chills and fever.  Respiratory: Negative for shortness of breath and wheezing.   Cardiovascular: Negative for chest pain, palpitations and leg swelling.  Musculoskeletal: Positive for arthralgias. Negative for back pain and gait problem.  Skin: Negative for rash.  Neurological: Negative for dizziness, weakness and light-headedness.  All other systems reviewed and are negative.   Per HPI unless specifically indicated above   Allergies as of 03/01/2021   No Known Allergies     Medication List       Accurate as of March 01, 2021  9:24 AM. If you have any questions, ask your nurse or doctor.        omeprazole 20 MG capsule Commonly known as: PRILOSEC Take 20 mg by mouth every morning.        Objective:   BP 118/81   Pulse 76   Temp 98.1 F (36.7 C)   Ht 5' 11.5" (1.816 m)   Wt 195 lb 6.4 oz (88.6 kg)   SpO2 96%   BMI 26.87 kg/m   Wt Readings from Last 3 Encounters:  03/01/21 195 lb 6.4 oz (88.6 kg)  04/20/20  193 lb (87.5 kg)  04/08/19 184 lb (83.5 kg)    Physical Exam Vitals and nursing note reviewed.  Constitutional:      General: He is not in acute distress.    Appearance: He is well-developed. He is not diaphoretic.  Eyes:     General: No scleral icterus.    Conjunctiva/sclera: Conjunctivae normal.  Neck:     Thyroid: No thyromegaly.  Cardiovascular:     Rate and Rhythm: Normal rate and regular rhythm.     Heart sounds: Normal heart sounds. No murmur heard.   Pulmonary:     Effort: Pulmonary effort is normal. No respiratory distress.     Breath sounds: Normal breath sounds. No wheezing.  Musculoskeletal:        General: Normal range of motion.     Cervical back: Neck supple.  Lymphadenopathy:     Cervical: No cervical adenopathy.  Skin:    General: Skin is warm and dry.     Findings: No rash.  Neurological:     Mental Status: He is alert and oriented to person, place, and time.     Coordination: Coordination normal.  Psychiatric:        Behavior: Behavior normal.     Had EKG and blood work  done yesterday, is in care everywhere and looks good from what I can see.  Assessment & Plan:   Problem List Items Addressed This Visit   None   Visit Diagnoses    Pre-op exam    -  Primary      Cleared for surgery Follow up plan: Return if symptoms worsen or fail to improve.  Counseling provided for all of the vaccine components No orders of the defined types were placed in this encounter.   Caryl Pina, MD [facility] 03/01/2021, 9:24 AM

## 2021-03-01 NOTE — Addendum Note (Signed)
Addended by: Adella Hare B on: 03/01/2021 10:03 AM   Modules accepted: Orders

## 2021-05-11 ENCOUNTER — Ambulatory Visit: Payer: 59 | Attending: Sports Medicine

## 2021-05-11 ENCOUNTER — Other Ambulatory Visit: Payer: Self-pay

## 2021-05-11 DIAGNOSIS — M25612 Stiffness of left shoulder, not elsewhere classified: Secondary | ICD-10-CM | POA: Insufficient documentation

## 2021-05-11 DIAGNOSIS — M67814 Other specified disorders of tendon, left shoulder: Secondary | ICD-10-CM | POA: Insufficient documentation

## 2021-05-11 DIAGNOSIS — M25512 Pain in left shoulder: Secondary | ICD-10-CM | POA: Insufficient documentation

## 2021-05-12 NOTE — Therapy (Signed)
[facility] [address] [place], [place], [address] Phone: [phone]   Fax:  [phone]  Physical Therapy Evaluation  Patient Details  Name: Wesley Baxter MRN: [id number] Date of Birth: 1971-05-28 Referring Provider (PT): Louis Matte   Encounter Date: 05/11/2021   PT End of Session - 05/11/21 0844     Visit Number 1    Number of Visits 12    Date for PT Re-Evaluation 06/22/21    Authorization Type U[employer] ;FOTO at least every 5th visit    PT Start Time 0730    PT Stop Time 0825    PT Time Calculation (min) 55 min    Activity Tolerance Patient tolerated treatment well    Behavior During Therapy [facility] for tasks assessed/performed             Past Medical History:  Diagnosis Date   Allergy    Hearing loss     Past Surgical History:  Procedure Laterality Date   ROTATOR CUFF REPAIR Right    in 1998 and again in 2003    There were no vitals filed for this visit.    Subjective Assessment - 05/11/21 0743     Subjective [name] presents to OPPT with history of L shoulder pain with surgical intervention 03/22/21. Patient states that he has been going about his day with minimal limitations but avoids most thing that he presumes caused his pain in the shoulder. He walks with his dog for a good distance but that is about it. He sleeps well now as the pain is reduced but he just recently got out of the sling. He has not been using it much and would have started PT earlier than (6 weeks postop) but was waiting for another facility prior to ascheduling with [facility]. He reports concerns for chronic "popping in th shoulder'. He states that the popping gernerally releases the tension but is afraid if he is causing any problems. He reports a intermittent numbness and tingling but improving.    Limitations House hold activities    Patient Stated Goals To be able to use my arm like normal again    Currently in Pain? Yes     Pain Score 6     Pain Location Shoulder    Pain Orientation Right    Pain Descriptors / Indicators Discomfort;Aching    Pain Type Surgical pain    Pain Radiating Towards finger and bicep region    Aggravating Factors  use of LUE               05/11/21 0001  Assessment  Medical Diagnosis L TSA; Bicep Tenodesis; clavicle resection (open); Soft tissue tumor removal  Referring Provider (PT) Louis Matte  Onset Date/Surgical Date 03/22/21  Hand Dominance Right  Prior Therapy No  Precautions  Precautions Shoulder  Type of Shoulder Precautions Limited ER and Abduction  Restrictions  Weight Bearing Restrictions Yes  Balance Screen  Has the patient fallen in the past 6 months No  Home Environment  Living Environment Private residence  Observation/Other Assessments  Observations L shoulder elevated; incision scar noted along anterior L GHJ  Cranial Nerve(s) unremarkable  Sensation  Light Touch Appears Intact  Posture/Postural Control  Posture/Postural Control Postural limitations  Postural Limitations Rounded Shoulders (L elevated)  ROM / Strength  AROM / PROM / Strength AROM;PROM;Strength  AROM  AROM Assessment Site Shoulder  Overall AROM  Deficits  Overall AROM Comments pain limiting L ; R side defictis d/t hx  of 2 RCR  Right/Left Shoulder Left  Left Shoulder Flexion 60 Degrees  Left Shoulder ABduction 50 Degrees  Left Shoulder Internal Rotation  (L1)  Strength  Overall Strength Unable to assess;Due to precautions  Palpation  Palpation comment TTP at the L UT/LS region, TTP at L pectoral region                 Objective measurements completed on examination: See above findings.                 PT Short Term Goals - 05/11/21 0730       PT SHORT TERM GOAL #1   Title indep with initial HEP    Baseline lacks knowledge of appropriate exercises    Time 1    Period Weeks    Status New    Target Date 05/18/21               PT  Long Term Goals - 05/11/21 2030       PT LONG TERM GOAL #1   Title Independent with advanced HEP    Baseline lacks knowledege of appropriate exercises    Time 4    Period Weeks    Status New    Target Date 06/08/21      PT LONG TERM GOAL #2   Title Active shoulder flexion to 145 degrees so the patient can easily reach overhead.    Baseline AROM <100    Time 6    Period Weeks    Status New    Target Date 06/22/21      PT LONG TERM GOAL #3   Title Active ER to 70 degrees+ to allow for easily donning/doffing of apparel.    Baseline <20    Time 6    Period Weeks    Status New    Target Date 06/22/21      PT LONG TERM GOAL #4   Title Increase ROM so patient is able to reach behind back.    Baseline reaches to back of hip at illiac crest    Time 6    Period Weeks    Status New    Target Date 06/22/21      PT LONG TERM GOAL #5   Title Increase shoulder strength to a solid 4+/5 to increase stability for performance of functional activities.    Baseline not full ROM to assess    Time 6    Period Weeks    Status New    Target Date 06/22/21      Additional Long Term Goals   Additional Long Term Goals Yes      PT LONG TERM GOAL #6   Title Perform ADL's with pain not > 3/10.    Baseline avoids some ADLs    Time 6    Period Weeks    Status New    Target Date 06/22/21                    Plan - 05/11/21 0730     Clinical Impression Statement Javione Stracener is a pleasant and well nourished 50 yo male presenting ot OPPT 6 week s/p L TSA, bicep tenodesis. clavicle resection open, and excision of soft tissue tumor from subscapularis muscle. He presents with baseline restrictions in ROM and strength. He was educated to avoid abcuction, extension, and external rotation at this time for conservative care. He began table slides for HEP and will progress with isometric strength as  appropriate. Normal response to Vasopneumatic device noted today.    Personal Factors and  Comorbidities Past/Current Experience;Time since onset of injury/illness/exacerbation    Examination-Activity Limitations Lift;Carry;Reach Overhead    Examination-Participation Restrictions Yard Work;Occupation    Stability/Clinical Decision Making Stable/Uncomplicated    Clinical Decision Making Low    Rehab Potential Good    PT Frequency 2x / week    PT Duration 6 weeks    PT Treatment/Interventions ADLs/Self Care Home Management;Cryotherapy;Electrical Stimulation;Moist Heat;Therapeutic activities;Therapeutic exercise;Neuromuscular re-education;Manual techniques;Dry needling;Vasopneumatic Device    PT Next Visit Plan FOTO capture; table slides, isometric ER,IR, extension (with elbow at 90 deg)    Consulted and Agree with Plan of Care Patient             Patient will benefit from skilled therapeutic intervention in order to improve the following deficits and impairments:  Decreased mobility, Impaired tone, Increased edema, Decreased scar mobility, Impaired UE functional use, Decreased strength, Decreased activity tolerance  Visit Diagnosis: Stiffness of left shoulder, not elsewhere classified  Left shoulder pain, unspecified chronicity  Biceps tendonosis of left shoulder     Problem List There are no problems to display for this patient.   Levonne Spiller 05/12/2021, 8:47 AM  [facility] [address] [place], [place], [address] Phone: [phone]   Fax:  [phone]  Name: Anyelo Mccue MRN: [id number] Date of Birth: [birth year]

## 2021-05-12 NOTE — Addendum Note (Signed)
Addended by: Levonne Spiller on: 05/12/2021 08:50 AM   Modules accepted: Orders

## 2021-05-16 ENCOUNTER — Other Ambulatory Visit: Payer: Self-pay

## 2021-05-16 ENCOUNTER — Ambulatory Visit: Payer: 59

## 2021-05-16 DIAGNOSIS — M25612 Stiffness of left shoulder, not elsewhere classified: Secondary | ICD-10-CM

## 2021-05-16 DIAGNOSIS — M25512 Pain in left shoulder: Secondary | ICD-10-CM

## 2021-05-16 DIAGNOSIS — M67814 Other specified disorders of tendon, left shoulder: Secondary | ICD-10-CM

## 2021-05-16 NOTE — Therapy (Signed)
[facility] [address] [place], [place], [address] Phone: [phone]   Fax:  [phone]  Physical Therapy Treatment  Patient Details  Name: Timothee Gali MRN: [id number] Date of Birth: [birth year] Referring Provider (PT): Louis Matte   Encounter Date: 05/16/2021   PT End of Session - 05/16/21 1149     Visit Number 2    Number of Visits 12    Date for PT Re-Evaluation 06/22/21    Authorization Type UHC ;FOTO at least every 5th visit    PT Start Time 0818    PT Stop Time 0900    PT Time Calculation (min) 42 min             Past Medical History:  Diagnosis Date   Allergy    Hearing loss     Past Surgical History:  Procedure Laterality Date   ROTATOR CUFF REPAIR Right    in 1998 and again in 2003    There were no vitals filed for this visit.   Subjective Assessment - 05/16/21 0834     Subjective COVID-19 screening performed upon arrival. Patient states that he feels his shoulder has been improving. He has better ROM and has been consistent with his HEP. He wants to know if he will be able to kayak and fish.    Limitations House hold activities    Patient Stated Goals To be able to use my arm like normal again    Currently in Pain? Yes    Pain Score 4     Pain Location Shoulder    Pain Orientation Left    Pain Descriptors / Indicators Aching    Pain Onset More than a month ago    Aggravating Factors  use of LUE                [facility] PT Assessment - 05/16/21 0001       Assessment   Medical Diagnosis L TSA; Bicep Tenodesis; clavicle resection (open); Soft tissue tumor removal    Referring Provider (PT) Louis Matte    Onset Date/Surgical Date 03/22/21    Hand Dominance Right    Next MD Visit TBD    Prior Therapy History of 2 RCR to the R shoulder              [facility] Adult PT Treatment/Exercise - 05/16/21 0001       Exercises   Exercises Shoulder      Shoulder Exercises:  ROM/Strengthening   Other ROM/Strengthening Exercises finger ladder (to 23) wit hscapular retraction cues x 12      Shoulder Exercises: Isometric Strengthening   External Rotation Limitations 5 sec hold ( 2 mins )    Internal Rotation Limitations 5 sec hold ( 2 mins )      Shoulder Exercises: Stretch   Other Shoulder Stretches AAROM - dowel ER/ scaption      Vasopneumatic   Number Minutes Vasopneumatic  10 minutes    Vasopnuematic Location  Shoulder    Vasopneumatic Pressure Low    Vasopneumatic Temperature  34deg      Manual Therapy   Manual Therapy Joint mobilization;Soft tissue mobilization;Passive ROM    Manual therapy comments Pt supine LUE elevated on 2" support pillow;   improved with flexion to 120deg ; scapular blocking reduced GHJ anterior rotation    Joint Mobilization GHJ anterior-posterior mob gr III    Soft tissue mobilization pectoral stretch/pinning STM with    Passive ROM flexion, scaption, ER  PT Short Term Goals - 05/11/21 0730       PT SHORT TERM GOAL #1   Title indep with initial HEP    Baseline lacks knowledge of appropriate exercises    Time 1    Period Weeks    Status New    Target Date 05/18/21               PT Long Term Goals - 05/11/21 2030       PT LONG TERM GOAL #1   Title Independent with advanced HEP    Baseline lacks knowledege of appropriate exercises    Time 4    Period Weeks    Status New    Target Date 06/08/21      PT LONG TERM GOAL #2   Title Active shoulder flexion to 145 degrees so the patient can easily reach overhead.    Baseline AROM <100    Time 6    Period Weeks    Status New    Target Date 06/22/21      PT LONG TERM GOAL #3   Title Active ER to 70 degrees+ to allow for easily donning/doffing of apparel.    Baseline <20    Time 6    Period Weeks    Status New    Target Date 06/22/21      PT LONG TERM GOAL #4   Title Increase ROM so patient is able to reach behind back.    Baseline  reaches to back of hip at illiac crest    Time 6    Period Weeks    Status New    Target Date 06/22/21      PT LONG TERM GOAL #5   Title Increase shoulder strength to a solid 4+/5 to increase stability for performance of functional activities.    Baseline not full ROM to assess    Time 6    Period Weeks    Status New    Target Date 06/22/21      Additional Long Term Goals   Additional Long Term Goals Yes      PT LONG TERM GOAL #6   Title Perform ADL's with pain not > 3/10.    Baseline avoids some ADLs    Time 6    Period Weeks    Status New    Target Date 06/22/21                   Plan - 05/16/21 1150     Clinical Impression Statement Patient with excellent reponse to low level PT this visit with emphasis on PROM and scapular strength/mobility. He has greater tolerance to ER with added abduction distracton applied from therapist. scapular motor control and strenght to be emphasized to reduce excessive elevation of the L shoulder. Plan to continue per plan of care with contiunued education on safety and prevention of weakness/dislocation to the TSA. Skilled PT recommended.    Personal Factors and Comorbidities Past/Current Experience;Time since onset of injury/illness/exacerbation    Examination-Activity Limitations Lift;Carry;Reach Overhead    Examination-Participation Restrictions Yard Work;Occupation    Stability/Clinical Decision Making Stable/Uncomplicated    Clinical Decision Making Low    Rehab Potential Good    PT Frequency 2x / week    PT Duration 6 weeks    PT Treatment/Interventions ADLs/Self Care Home Management;Cryotherapy;Electrical Stimulation;Moist Heat;Therapeutic activities;Therapeutic exercise;Neuromuscular re-education;Manual techniques;Dry needling;Vasopneumatic Device    PT Next Visit Plan FOTO capture; table slides, isometric ER,IR, extension (with elbow at  90 deg)    Consulted and Agree with Plan of Care Patient             Patient will  benefit from skilled therapeutic intervention in order to improve the following deficits and impairments:  Decreased mobility, Impaired tone, Increased edema, Decreased scar mobility, Impaired UE functional use, Decreased strength, Decreased activity tolerance  Visit Diagnosis: Stiffness of left shoulder, not elsewhere classified  Left shoulder pain, unspecified chronicity  Biceps tendonosis of left shoulder     Problem List There are no problems to display for this patient.   Levonne Spiller PT, DPT 05/16/2021, 11:58 AM  [facility] [address] [place], [place], [address] Phone: [phone]   Fax:  [phone]  Name: Lemoine Goyne MRN: [id number] Date of Birth: October 14, 1970

## 2021-05-18 ENCOUNTER — Other Ambulatory Visit: Payer: Self-pay

## 2021-05-18 ENCOUNTER — Ambulatory Visit: Payer: 59

## 2021-05-18 DIAGNOSIS — M67814 Other specified disorders of tendon, left shoulder: Secondary | ICD-10-CM

## 2021-05-18 DIAGNOSIS — M25612 Stiffness of left shoulder, not elsewhere classified: Secondary | ICD-10-CM | POA: Diagnosis not present

## 2021-05-18 DIAGNOSIS — M25512 Pain in left shoulder: Secondary | ICD-10-CM

## 2021-05-18 NOTE — Therapy (Signed)
[facility] [address] [place], [place], [address] Phone: [phone]   Fax:  [phone]  Physical Therapy Treatment  Patient Details  Name: Wesley Baxter MRN: [id number] Date of Birth: [birth year] Referring Provider (PT): Louis Matte   Encounter Date: 05/18/2021   PT End of Session - 05/18/21 0901     Visit Number 3    Number of Visits 12    Date for PT Re-Evaluation 06/22/21    Authorization Type UHC ;FOTO at least every 5th visit    PT Start Time 0815    PT Stop Time 0908    PT Time Calculation (min) 53 min    Activity Tolerance Patient tolerated treatment well    Behavior During Therapy [facility] for tasks assessed/performed             Past Medical History:  Diagnosis Date   Allergy    Hearing loss     Past Surgical History:  Procedure Laterality Date   ROTATOR CUFF REPAIR Right    in 1998 and again in 2003    There were no vitals filed for this visit.   Subjective Assessment - 05/18/21 0838     Subjective COVID-19 screening performed upon arrival. Patient states that he has been doing pretty well overall - and has been fairly consistent with his HEP. He went fishing (lightly) yesterday with his dad.    Limitations House hold activities    Patient Stated Goals To be able to use my arm like normal again; to go fishing, kayaking, and being active    Currently in Pain? No/denies               [facility] Adult PT Treatment/Exercise - 05/18/21 0001       Exercises   Exercises Shoulder      Shoulder Exercises: Supine   Other Supine Exercises ER with dowel 1 0x 10 sec ( rest when painful)      Shoulder Exercises: Prone   Retraction Strengthening    Retraction Limitations scapular retraction (pillow under L UE)    Other Prone Exercises PROM IR/ER      Shoulder Exercises: Sidelying   Other Sidelying Exercises ER (attempted x 3 - limed ER, unable to perform)      Shoulder Exercises: Pulleys   Flexion  3 minutes   scapular elevation/limitations - fair quality     Manual Therapy   Manual Therapy Joint mobilization;Soft tissue mobilization;Passive ROM    Manual therapy comments Pt supine LUE elevated on 2" support pillow;   improved with flexion to 150deg ; scapular blocking reduced GHJ anterior rotation    Joint Mobilization GHJ anterior-posterior mob gr III    Soft tissue mobilization STM to lateral delt, scapular border and pectoral region    Passive ROM flexion, scaption, ER              PT Short Term Goals - 05/11/21 0730       PT SHORT TERM GOAL #1   Title indep with initial HEP    Baseline lacks knowledge of appropriate exercises    Time 1    Period Weeks    Status New    Target Date 05/18/21               PT Long Term Goals - 05/11/21 2030       PT LONG TERM GOAL #1   Title Independent with advanced HEP    Baseline lacks knowledege of appropriate exercises  Time 4    Period Weeks    Status New    Target Date 06/08/21      PT LONG TERM GOAL #2   Title Active shoulder flexion to 145 degrees so the patient can easily reach overhead.    Baseline AROM <100    Time 6    Period Weeks    Status New    Target Date 06/22/21      PT LONG TERM GOAL #3   Title Active ER to 70 degrees+ to allow for easily donning/doffing of apparel.    Baseline <20    Time 6    Period Weeks    Status New    Target Date 06/22/21      PT LONG TERM GOAL #4   Title Increase ROM so patient is able to reach behind back.    Baseline reaches to back of hip at illiac crest    Time 6    Period Weeks    Status New    Target Date 06/22/21      PT LONG TERM GOAL #5   Title Increase shoulder strength to a solid 4+/5 to increase stability for performance of functional activities.    Baseline not full ROM to assess    Time 6    Period Weeks    Status New    Target Date 06/22/21      Additional Long Term Goals   Additional Long Term Goals Yes      PT LONG TERM GOAL #6    Title Perform ADL's with pain not > 3/10.    Baseline avoids some ADLs    Time 6    Period Weeks    Status New    Target Date 06/22/21                   Plan - 05/18/21 1104     Clinical Impression Statement Patient with excellent participation in PT this session, with tolerance to manaual therapy intervention, IFC E-stim, and AAROM activities. He improved tolerance to ER with dowel. He was able to attain 150 deg of shoulder flexion in supine. Some limitation with an elevated scapula noted with pulleys this initial trial - will hold ont this item until improved scapular sequence and mobility attained. Progresison of scapular strength promted via pron scapular retraction and review of home exercise with seated scpular retraction. Plan of care to conitinue as indicated.    Personal Factors and Comorbidities Past/Current Experience;Time since onset of injury/illness/exacerbation    Examination-Activity Limitations Lift;Carry;Reach Overhead    Examination-Participation Restrictions Yard Work;Occupation    Stability/Clinical Decision Making Stable/Uncomplicated    Clinical Decision Making Low    Rehab Potential Good    PT Frequency 2x / week    PT Duration 6 weeks    PT Treatment/Interventions ADLs/Self Care Home Management;Cryotherapy;Electrical Stimulation;Moist Heat;Therapeutic activities;Therapeutic exercise;Neuromuscular re-education;Manual techniques;Dry needling;Vasopneumatic Device    PT Next Visit Plan Low grade isometrics, TENS unit/ modalities as needed with activity for symptom management,    PT Home Exercise Plan see past pt instructions.    Consulted and Agree with Plan of Care Patient             Patient will benefit from skilled therapeutic intervention in order to improve the following deficits and impairments:  Decreased mobility, Impaired tone, Increased edema, Decreased scar mobility, Impaired UE functional use, Decreased strength, Decreased activity  tolerance  Visit Diagnosis: Stiffness of left shoulder, not elsewhere classified  Left shoulder  pain, unspecified chronicity  Biceps tendonosis of left shoulder     Problem List There are no problems to display for this patient.   Levonne Spiller PT, DPT 05/18/2021, 11:15 AM  [facility] [address] [place], [place], [address] Phone: [phone]   Fax:  [phone]  Name: Wesley Baxter MRN: [id number] Date of Birth: [birth year]

## 2021-05-22 ENCOUNTER — Ambulatory Visit: Payer: 59

## 2021-05-22 ENCOUNTER — Other Ambulatory Visit: Payer: Self-pay

## 2021-05-22 DIAGNOSIS — M67814 Other specified disorders of tendon, left shoulder: Secondary | ICD-10-CM

## 2021-05-22 DIAGNOSIS — M25612 Stiffness of left shoulder, not elsewhere classified: Secondary | ICD-10-CM | POA: Diagnosis not present

## 2021-05-22 DIAGNOSIS — M25512 Pain in left shoulder: Secondary | ICD-10-CM

## 2021-05-22 NOTE — Therapy (Signed)
[facility] [address] [place], [place], [address] Phone: [phone]   Fax:  [phone]  Physical Therapy Treatment  Patient Details  Name: Wesley Baxter MRN: [id number] Date of Birth: 1971/09/16 Referring Provider (PT): Louis Matte   Encounter Date: 05/22/2021   PT End of Session - 05/22/21 1143     Visit Number 4    Number of Visits 12    Date for PT Re-Evaluation 06/22/21    Authorization Type UHC ;FOTO at least every 5th visit    PT Start Time 0815    PT Stop Time 0900    PT Time Calculation (min) 45 min    Activity Tolerance Patient tolerated treatment well    Behavior During Therapy [facility] for tasks assessed/performed             Past Medical History:  Diagnosis Date   Allergy    Hearing loss     Past Surgical History:  Procedure Laterality Date   ROTATOR CUFF REPAIR Right    in 1998 and again in 2003    There were no vitals filed for this visit.   Subjective Assessment - 05/22/21 0838     Subjective COVID-19 screening performed upon arrival. Patient states that he felt fine over the weekned- he had a slight pop in the shoulder and it felt like someone thumped it hard. He states that he has been trying to use it more with reaching for stuff, opening the frigde, and keeping it on top of steering wheel.    Limitations House hold activities    Patient Stated Goals To be able to use my arm like normal again; to go fishing, kayaking, and being active    Currently in Pain? No/denies    Pain Score 4     Pain Location Shoulder    Pain Orientation Left    Pain Descriptors / Indicators Aching    Pain Type Surgical pain    Pain Onset More than a month ago    Aggravating Factors  use of LUE                  [facility] Adult PT Treatment/Exercise - 05/22/21 0001       Exercises   Exercises Shoulder      Shoulder Exercises: Supine   Other Supine Exercises ER with dowel 10x 10 sec ( rest when painful)       Shoulder Exercises: Seated   Row Strengthening;15 reps    Theraband Level (Shoulder Row) Level 3 (Green)    Other Seated Exercises flex bar - Green ([place] and [place])      Shoulder Exercises: Sidelying   Other Sidelying Exercises scapular retraction (15)      Shoulder Exercises: Standing   External Rotation Strengthening;Other (comment)   1 min x 2  side stepping only   Theraband Level (Shoulder External Rotation) Level 3 (Green)    Corporate treasurer;Other (comment);Theraband   1 min x 2  side stepping only   Theraband Level (Shoulder Internal Rotation) Level 3 (Green)      Shoulder Exercises: ROM/Strengthening   UBE (Upper Arm Bike) 2 mins retro only      Shoulder Exercises: Stretch   Wall Stretch - Flexion 10 seconds;5 reps    Wall Stretch - Flexion Limitations L UE elevation up the wall with rotation      Manual Therapy   Manual Therapy Joint mobilization;Soft tissue mobilization;Passive ROM    Manual therapy comments Pt supine  LUE elevated on 2" support pillow;   improved with flexion to 150deg ; scapular blocking reduced GHJ anterior rotation    Joint Mobilization GHJ anterior-posterior mob gr III    Soft tissue mobilization STM to lateral delt, scapular border and pectoral region    Passive ROM flexion, scaption, ER                      PT Short Term Goals - 05/11/21 0730       PT SHORT TERM GOAL #1   Title indep with initial HEP    Baseline lacks knowledge of appropriate exercises    Time 1    Period Weeks    Status New    Target Date 05/18/21               PT Long Term Goals - 05/11/21 2030       PT LONG TERM GOAL #1   Title Independent with advanced HEP    Baseline lacks knowledege of appropriate exercises    Time 4    Period Weeks    Status New    Target Date 06/08/21      PT LONG TERM GOAL #2   Title Active shoulder flexion to 145 degrees so the patient can easily reach overhead.    Baseline AROM <100    Time 6     Period Weeks    Status New    Target Date 06/22/21      PT LONG TERM GOAL #3   Title Active ER to 70 degrees+ to allow for easily donning/doffing of apparel.    Baseline <20    Time 6    Period Weeks    Status New    Target Date 06/22/21      PT LONG TERM GOAL #4   Title Increase ROM so patient is able to reach behind back.    Baseline reaches to back of hip at illiac crest    Time 6    Period Weeks    Status New    Target Date 06/22/21      PT LONG TERM GOAL #5   Title Increase shoulder strength to a solid 4+/5 to increase stability for performance of functional activities.    Baseline not full ROM to assess    Time 6    Period Weeks    Status New    Target Date 06/22/21      Additional Long Term Goals   Additional Long Term Goals Yes      PT LONG TERM GOAL #6   Title Perform ADL's with pain not > 3/10.    Baseline avoids some ADLs    Time 6    Period Weeks    Status New    Target Date 06/22/21                   Plan - 05/22/21 1145     Clinical Impression Statement Patient with fair participation in today's sesision. Progression of isometric contraction with side stepping and green tband encouraged to prevent present shoulder elevation. He achieves approximately 160 deg of shoulder flexion and greater than 60 deg ofER in supine. Skilled PT to progress strength and scapular function these upcoming visits.    Personal Factors and Comorbidities Past/Current Experience;Time since onset of injury/illness/exacerbation    Examination-Activity Limitations Lift;Carry;Reach Overhead    Examination-Participation Restrictions Yard Work;Occupation    Stability/Clinical Decision Making Stable/Uncomplicated    Clinical Decision Making  Low    Rehab Potential Good    PT Frequency 2x / week    PT Duration 6 weeks    PT Treatment/Interventions ADLs/Self Care Home Management;Cryotherapy;Electrical Stimulation;Moist Heat;Therapeutic activities;Therapeutic  exercise;Neuromuscular re-education;Manual techniques;Dry needling;Vasopneumatic Device    PT Next Visit Plan Low grade isometrics, TENS unit/ modalities as needed with activity for symptom management,    PT Home Exercise Plan see past pt instructions.    Consulted and Agree with Plan of Care Patient             Patient will benefit from skilled therapeutic intervention in order to improve the following deficits and impairments:  Decreased mobility, Impaired tone, Increased edema, Decreased scar mobility, Impaired UE functional use, Decreased strength, Decreased activity tolerance  Visit Diagnosis: Stiffness of left shoulder, not elsewhere classified  Left shoulder pain, unspecified chronicity  Biceps tendonosis of left shoulder     Problem List There are no problems to display for this patient.   Levonne Spiller PT, DPT 05/22/2021, 11:50 AM  [facility] [address] [place], [place], [address] Phone: [phone]   Fax:  [phone]  Name: Wesley Baxter MRN: [id number] Date of Birth: 1970/10/22

## 2021-05-24 ENCOUNTER — Ambulatory Visit: Payer: 59

## 2021-05-24 ENCOUNTER — Other Ambulatory Visit: Payer: Self-pay

## 2021-05-24 DIAGNOSIS — M25512 Pain in left shoulder: Secondary | ICD-10-CM

## 2021-05-24 DIAGNOSIS — M25612 Stiffness of left shoulder, not elsewhere classified: Secondary | ICD-10-CM

## 2021-05-24 DIAGNOSIS — M67814 Other specified disorders of tendon, left shoulder: Secondary | ICD-10-CM

## 2021-05-24 NOTE — Therapy (Signed)
[facility] [place], [place], [address] Phone: [phone]   Fax:  [phone]  Physical Therapy Treatment  Patient Details  Name: Wesley Baxter MRN: [id number] Date of Birth: 1970-11-14 Referring Provider (PT): Alfredia Client   Encounter Date: 05/24/2021   PT End of Session - 05/24/21 0933     Visit Number 5    Number of Visits 12    Date for PT Re-Evaluation 06/22/21    Authorization Type UHC ;FOTO at least every 5th visit    PT Start Time 0815    PT Stop Time 0910    PT Time Calculation (min) 55 min             Past Medical History:  Diagnosis Date   Allergy    Hearing loss     Past Surgical History:  Procedure Laterality Date   ROTATOR CUFF REPAIR Right    in 1998 and again in 2003    There were no vitals filed for this visit.   Subjective Assessment - 05/24/21 0821     Subjective COVID-19 screening performed upon arrival. Patient states that he was a little sore after last session. But he was able to do a little bit over the time.    Limitations House hold activities               [facility] Adult PT Treatment/Exercise - 05/24/21 0001       Exercises   Exercises Shoulder      Shoulder Exercises: Supine   Protraction Strengthening    Protraction Limitations rhythmic stabilization against therapist resistance at 90 deg flexion (up/down/in/out)      Shoulder Exercises: Standing   External Rotation Strengthening;Other (comment)    Theraband Level (Shoulder External Rotation) Level 3 (Green)    External Rotation Limitations isometric  walking side steps    Internal Rotation Strengthening;Other (comment);Theraband    Theraband Level (Shoulder Internal Rotation) Level 3 (Green)    Internal Rotation Limitations isometric walking side stepping only      Shoulder Exercises: Therapy Ball   Other Therapy Ball Exercises physioball hugs for scapular retraction 3 x 10      Shoulder Exercises:  ROM/Strengthening   Prot/Ret//Elev/Dep --    Other ROM/Strengthening Exercises finger ladder x 5  ( abd/ flex0    Other ROM/Strengthening Exercises UE ranger flex and abd  x 2 mins each      Shoulder Exercises: Stretch   Other Shoulder Stretches AAROM scaption with dowel      Modalities   Modalities Vasopneumatic      Vasopneumatic   Number Minutes Vasopneumatic  10 minutes    Vasopnuematic Location  Shoulder    Vasopneumatic Pressure Low    Vasopneumatic Temperature  34deg      Manual Therapy   Manual Therapy Joint mobilization;Soft tissue mobilization;Passive ROM    Manual therapy comments Pt supine LUE elevated on 2" support pillow;   improved with flexion to 152 deg ; scapular blocking reduced GHJ anterior rotation, STM to the pectoral region, bicep tenodn and scar tissue to reduce elevated AC joint    Joint Mobilization GHJ anterior-posterior mob gr III    Soft tissue mobilization STM to lateral delt, scapular border and pectoral region    Passive ROM flexion, scaption, ER                      PT Short Term Goals - 05/11/21 0730  PT SHORT TERM GOAL #1   Title indep with initial HEP    Baseline lacks knowledge of appropriate exercises    Time 1    Period Weeks    Status New    Target Date 05/18/21               PT Long Term Goals - 05/11/21 2030       PT LONG TERM GOAL #1   Title Independent with advanced HEP    Baseline lacks knowledege of appropriate exercises    Time 4    Period Weeks    Status New    Target Date 06/08/21      PT LONG TERM GOAL #2   Title Active shoulder flexion to 145 degrees so the patient can easily reach overhead.    Baseline AROM <100    Time 6    Period Weeks    Status New    Target Date 06/22/21      PT LONG TERM GOAL #3   Title Active ER to 70 degrees+ to allow for easily donning/doffing of apparel.    Baseline <20    Time 6    Period Weeks    Status New    Target Date 06/22/21      PT LONG TERM  GOAL #4   Title Increase ROM so patient is able to reach behind back.    Baseline reaches to back of hip at illiac crest    Time 6    Period Weeks    Status New    Target Date 06/22/21      PT LONG TERM GOAL #5   Title Increase shoulder strength to a solid 4+/5 to increase stability for performance of functional activities.    Baseline not full ROM to assess    Time 6    Period Weeks    Status New    Target Date 06/22/21      Additional Long Term Goals   Additional Long Term Goals Yes      PT LONG TERM GOAL #6   Title Perform ADL's with pain not > 3/10.    Baseline avoids some ADLs    Time 6    Period Weeks    Status New    Target Date 06/22/21                   Plan - 05/24/21 0933     Clinical Impression Statement Patient progressed very well and able to perform  35mns of shoulder ER/IR isometrics with green tband side stepping. He reports some soreness in the hsoulder but reduces with rest. Progressed shoulder stability with supine rhythmisc stabilization techniques. No increased pain and normal respons to vaso-device this visit. Skilled PT recommended to continue with interventions emphasizeing adequate schapulohumeral sequnecing with elevation and improve strength for functional ADLs.    Personal Factors and Comorbidities Past/Current Experience;Time since onset of injury/illness/exacerbation    Examination-Activity Limitations Lift;Carry;Reach Overhead    Examination-Participation Restrictions Yard Work;Occupation    Stability/Clinical Decision Making Stable/Uncomplicated    Clinical Decision Making Low    Rehab Potential Good    PT Frequency 2x / week    PT Duration 6 weeks    PT Treatment/Interventions ADLs/Self Care Home Management;Cryotherapy;Electrical Stimulation;Moist Heat;Therapeutic activities;Therapeutic exercise;Neuromuscular re-education;Manual techniques;Dry needling;Vasopneumatic Device    PT Next Visit Plan Low grade isometrics, TENS unit/  modalities as needed with activity for symptom management,  Patient will benefit from skilled therapeutic intervention in order to improve the following deficits and impairments:  Decreased mobility, Impaired tone, Increased edema, Decreased scar mobility, Impaired UE functional use, Decreased strength, Decreased activity tolerance  Visit Diagnosis: Stiffness of left shoulder, not elsewhere classified  Left shoulder pain, unspecified chronicity  Biceps tendonosis of left shoulder     Problem List There are no problems to display for this patient.   Marylou Mccoy PT, DPT 05/24/2021, 9:38 AM  [facility] [address] [place], [place], [address] Phone: [phone]   Fax:  [phone]  Name: Jaxson Anglin MRN: [id number] Date of Birth: [birth year]

## 2021-05-30 ENCOUNTER — Ambulatory Visit: Payer: 59

## 2021-05-30 ENCOUNTER — Other Ambulatory Visit: Payer: Self-pay

## 2021-05-30 DIAGNOSIS — M25612 Stiffness of left shoulder, not elsewhere classified: Secondary | ICD-10-CM | POA: Diagnosis not present

## 2021-05-30 DIAGNOSIS — M25512 Pain in left shoulder: Secondary | ICD-10-CM

## 2021-05-30 DIAGNOSIS — M67814 Other specified disorders of tendon, left shoulder: Secondary | ICD-10-CM

## 2021-05-30 NOTE — Therapy (Signed)
[facility] [address] [place], [place], [address] Phone: [phone]   Fax:  [phone]  Physical Therapy Treatment  Patient Details  Name: Wesley Baxter MRN: [id number] Date of Birth: 04-Feb-1971 Referring Provider (PT): Louis Matte   Encounter Date: 05/30/2021   PT End of Session - 05/30/21 1133     Visit Number 6    Number of Visits 12    Date for PT Re-Evaluation 06/22/21    PT Start Time 0734    PT Stop Time 0823    PT Time Calculation (min) 49 min    Activity Tolerance Patient tolerated treatment well    Behavior During Therapy [facility] for tasks assessed/performed             Past Medical History:  Diagnosis Date   Allergy    Hearing loss     Past Surgical History:  Procedure Laterality Date   ROTATOR CUFF REPAIR Right    in 1998 and again in 2003    There were no vitals filed for this visit.   Subjective Assessment - 05/30/21 0745     Subjective COVID-19 screening performed upon arrival. Patient inquires "what can I do to hurt it at this time" He says he feels he has been babying the shoulder and does not mind a little pain but has a hard time gauging what he can and cant do. He has been compliant with HEP.    Pertinent History --    Limitations House hold activities                  [facility] Adult PT Treatment/Exercise - 05/30/21 0001       Exercises   Exercises Shoulder      Shoulder Exercises: Supine   Other Supine Exercises elbow prop ups x 15      Shoulder Exercises: Seated   Other Seated Exercises ER with the cane stretchx 1 min    Other Seated Exercises scapular row with 40# (3 x 10)      Shoulder Exercises: Prone   Other Prone Exercises quadruped alt UE reach out with VCs to squeeze shoulder blades      Shoulder Exercises: Standing   External Rotation Strengthening    Theraband Level (Shoulder External Rotation) Level 2 (Red)    External Rotation Limitations stabilize with  LUE while performinc wall clocks with RUE 2 x 8reps    Other Standing Exercises wall slide with 2# wrist weight (2 x )    Other Standing Exercises wall slide abduction with VCs to keep hand in visual field      Shoulder Exercises: ROM/Strengthening   UBE (Upper Arm Bike) 4' retro; 4' fwd ( lvl 60rpm)    Other ROM/Strengthening Exercises wall washes      Vasopneumatic   Number Minutes Vasopneumatic  10 minutes    Vasopnuematic Location  Shoulder    Vasopneumatic Pressure Low    Vasopneumatic Temperature  34deg      Manual Therapy   Manual Therapy Joint mobilization;Soft tissue mobilization;Passive ROM    Joint Mobilization GHJ anterior-posterior mob gr III    Passive ROM flexion, scaption, ER                      PT Short Term Goals - 05/11/21 0730       PT SHORT TERM GOAL #1   Title indep with initial HEP    Baseline lacks knowledge of appropriate exercises  Time 1    Period Weeks    Status New    Target Date 05/18/21               PT Long Term Goals - 05/11/21 2030       PT LONG TERM GOAL #1   Title Independent with advanced HEP    Baseline lacks knowledege of appropriate exercises    Time 4    Period Weeks    Status New    Target Date 06/08/21      PT LONG TERM GOAL #2   Title Active shoulder flexion to 145 degrees so the patient can easily reach overhead.    Baseline AROM <100    Time 6    Period Weeks    Status New    Target Date 06/22/21      PT LONG TERM GOAL #3   Title Active ER to 70 degrees+ to allow for easily donning/doffing of apparel.    Baseline <20    Time 6    Period Weeks    Status New    Target Date 06/22/21      PT LONG TERM GOAL #4   Title Increase ROM so patient is able to reach behind back.    Baseline reaches to back of hip at illiac crest    Time 6    Period Weeks    Status New    Target Date 06/22/21      PT LONG TERM GOAL #5   Title Increase shoulder strength to a solid 4+/5 to increase stability  for performance of functional activities.    Baseline not full ROM to assess    Time 6    Period Weeks    Status New    Target Date 06/22/21      Additional Long Term Goals   Additional Long Term Goals Yes      PT LONG TERM GOAL #6   Title Perform ADL's with pain not > 3/10.    Baseline avoids some ADLs    Time 6    Period Weeks    Status New    Target Date 06/22/21                   Plan - 05/30/21 1216     Clinical Impression Statement Patient progressing with AAROM and strength this visit. He is appropriate to continue skilled PT at this time with educatiuon on the improtance of consistency with HEP to regain strength and motor control. He is able to achieve 145 deg of flexion in standing and 155deg in supine. Abduction at 150deg , ER 55 deg and IR at 80 deg. He appears to be healing within normal protocol and plan of care.    Personal Factors and Comorbidities Past/Current Experience;Time since onset of injury/illness/exacerbation    Examination-Activity Limitations Lift;Carry;Reach Overhead    Examination-Participation Restrictions Yard Work;Occupation    Stability/Clinical Decision Making Stable/Uncomplicated    Clinical Decision Making Low             Patient will benefit from skilled therapeutic intervention in order to improve the following deficits and impairments:  Decreased mobility, Impaired tone, Increased edema, Decreased scar mobility, Impaired UE functional use, Decreased strength, Decreased activity tolerance  Visit Diagnosis: Stiffness of left shoulder, not elsewhere classified  Left shoulder pain, unspecified chronicity  Biceps tendonosis of left shoulder     Problem List There are no problems to display for this patient.   Nolberto Hanlon  Shantea Poulton PT, DPT  05/30/2021, 2:41 PM  [facility] [address] [place], [place], [address] Phone: [phone]   Fax:  [phone]  Name: Wesley Baxter MRN: [id number] Date of Birth: 1971-02-14

## 2021-06-01 ENCOUNTER — Other Ambulatory Visit: Payer: Self-pay

## 2021-06-01 ENCOUNTER — Ambulatory Visit: Payer: 59 | Attending: Sports Medicine

## 2021-06-01 DIAGNOSIS — M25612 Stiffness of left shoulder, not elsewhere classified: Secondary | ICD-10-CM | POA: Diagnosis not present

## 2021-06-01 DIAGNOSIS — M25512 Pain in left shoulder: Secondary | ICD-10-CM | POA: Insufficient documentation

## 2021-06-01 DIAGNOSIS — M67814 Other specified disorders of tendon, left shoulder: Secondary | ICD-10-CM | POA: Diagnosis present

## 2021-06-01 NOTE — Therapy (Signed)
[facility] [address] [place], [place], [address] Phone: [phone]   Fax:  [phone]  Physical Therapy Treatment  Patient Details  Name: Wesley Baxter MRN: [id number] Date of Birth: 1971/05/06 Referring Provider (PT): Louis Matte   Encounter Date: 06/01/2021   PT End of Session - 06/01/21 1036     Visit Number 7    Number of Visits 12    Date for PT Re-Evaluation 06/22/21    Authorization Type UHC ;FOTO at least every 5th visit    PT Start Time 0733    PT Stop Time 0817    PT Time Calculation (min) 44 min    Activity Tolerance Patient tolerated treatment well    Behavior During Therapy [facility] for tasks assessed/performed             Past Medical History:  Diagnosis Date   Allergy    Hearing loss     Past Surgical History:  Procedure Laterality Date   ROTATOR CUFF REPAIR Right    in 1998 and again in 2003    There were no vitals filed for this visit.   Subjective Assessment - 06/01/21 0739     Subjective COVID-19 screening performed upon arrival. Patient reports that he went fishing and finished pressure washing yesterday. He states that he did not have any issues with this. He denies pain or discomfotrt this visit.    Limitations House hold activities    Patient Stated Goals To be able to use my arm like normal again; to go fishing, kayaking, and being active    Pain Score 0-No pain                [facility] Adult PT Treatment/Exercise - 06/01/21 0001       Neuro Re-ed    Neuro Re-ed Details  rhythmic initiation, rhythmic stabilization x 5 x 1 min      Exercises   Exercises Shoulder      Shoulder Exercises: Pulleys   Flexion 1 minute    Scaption 1 minute    ABduction 1 minute      Shoulder Exercises: ROM/Strengthening   UBE (Upper Arm Bike) 4' retro; 4' fwd ( lvl 70rpm)      Shoulder Exercises: Isometric Strengthening   Other Isometric Exercises step outs with green tband 2 x 10 (IR/ER)                 PT Short Term Goals - 05/11/21 0730       PT SHORT TERM GOAL #1   Title indep with initial HEP    Baseline lacks knowledge of appropriate exercises    Time 1    Period Weeks    Status New    Target Date 05/18/21               PT Long Term Goals - 05/11/21 2030       PT LONG TERM GOAL #1   Title Independent with advanced HEP    Baseline lacks knowledege of appropriate exercises    Time 4    Period Weeks    Status New    Target Date 06/08/21      PT LONG TERM GOAL #2   Title Active shoulder flexion to 145 degrees so the patient can easily reach overhead.    Baseline AROM <100    Time 6    Period Weeks    Status New    Target Date 06/22/21  PT LONG TERM GOAL #3   Title Active ER to 70 degrees+ to allow for easily donning/doffing of apparel.    Baseline <20    Time 6    Period Weeks    Status New    Target Date 06/22/21      PT LONG TERM GOAL #4   Title Increase ROM so patient is able to reach behind back.    Baseline reaches to back of hip at illiac crest    Time 6    Period Weeks    Status New    Target Date 06/22/21      PT LONG TERM GOAL #5   Title Increase shoulder strength to a solid 4+/5 to increase stability for performance of functional activities.    Baseline not full ROM to assess    Time 6    Period Weeks    Status New    Target Date 06/22/21      Additional Long Term Goals   Additional Long Term Goals Yes      PT LONG TERM GOAL #6   Title Perform ADL's with pain not > 3/10.    Baseline avoids some ADLs    Time 6    Period Weeks    Status New    Target Date 06/22/21                   Plan - 06/01/21 1036     Clinical Impression Statement Paient with improved abduction with manual therapy  this visit, He has limited ER strength with complaints of burning pain with repeated isometric strengthening and limited control with ohter external rotation exercise in standing requiring tactile cueing and  assistance to activate actively. He progressed well with forward flexion and scaption and will benefit from prone lying scapular strengthening.    Personal Factors and Comorbidities Past/Current Experience;Time since onset of injury/illness/exacerbation    Examination-Activity Limitations Lift;Carry;Reach Overhead    Examination-Participation Restrictions Yard Work;Occupation    Stability/Clinical Decision Making Stable/Uncomplicated    Clinical Decision Making Low    Rehab Potential Good    PT Frequency 2x / week    PT Duration 6 weeks    PT Treatment/Interventions ADLs/Self Care Home Management;Cryotherapy;Electrical Stimulation;Moist Heat;Therapeutic activities;Therapeutic exercise;Neuromuscular re-education;Manual techniques;Dry needling;Vasopneumatic Device    PT Next Visit Plan Prone laying Lt shlder extension, ER, IR, scapular punches in supine - progress to with weight, wall slides with wrist cuff for eccentric motor control., wall clocks (lt hand stable)    PT Home Exercise Plan see past pt instructions.    Consulted and Agree with Plan of Care Patient             Patient will benefit from skilled therapeutic intervention in order to improve the following deficits and impairments:  Decreased mobility, Impaired tone, Increased edema, Decreased scar mobility, Impaired UE functional use, Decreased strength, Decreased activity tolerance  Visit Diagnosis: Stiffness of left shoulder, not elsewhere classified  Left shoulder pain, unspecified chronicity  Biceps tendonosis of left shoulder     Problem List There are no problems to display for this patient.   Levonne Spiller PT, DPT 06/01/2021, 10:47 AM  [facility] [address] [place], [place], [address] Phone: [phone]   Fax:  [phone]  Name: Wesley Baxter MRN: [id number] Date of Birth: Sep 03, 1971

## 2021-06-08 ENCOUNTER — Ambulatory Visit: Payer: 59 | Admitting: Physical Therapy

## 2021-06-09 ENCOUNTER — Other Ambulatory Visit: Payer: Self-pay

## 2021-06-09 ENCOUNTER — Encounter: Payer: Self-pay | Admitting: Physical Therapy

## 2021-06-09 ENCOUNTER — Ambulatory Visit: Payer: 59 | Admitting: Physical Therapy

## 2021-06-09 DIAGNOSIS — M67814 Other specified disorders of tendon, left shoulder: Secondary | ICD-10-CM

## 2021-06-09 DIAGNOSIS — M25612 Stiffness of left shoulder, not elsewhere classified: Secondary | ICD-10-CM

## 2021-06-09 DIAGNOSIS — M25512 Pain in left shoulder: Secondary | ICD-10-CM

## 2021-06-09 NOTE — Therapy (Signed)
[facility] [address] [place], [place], [address] Phone: [phone]   Fax:  [phone]  Physical Therapy Treatment  Patient Details  Name: Wesley Baxter MRN: [id number] Date of Birth: 27-Mar-1971 Referring Provider (PT): Louis Matte   Encounter Date: 06/09/2021   PT End of Session - 06/09/21 0822     Visit Number 8    Number of Visits 12    Date for PT Re-Evaluation 06/22/21    Authorization Type UHC ;FOTO at least every 5th visit    PT Start Time 0734    PT Stop Time 0813    PT Time Calculation (min) 39 min    Activity Tolerance Patient tolerated treatment well    Behavior During Therapy [facility] for tasks assessed/performed             Past Medical History:  Diagnosis Date   Allergy    Hearing loss     Past Surgical History:  Procedure Laterality Date   ROTATOR CUFF REPAIR Right    in 1998 and again in 2003    There were no vitals filed for this visit.   Subjective Assessment - 06/09/21 0734     Subjective COVID-19 screening performed upon arrival.    Limitations House hold activities    Patient Stated Goals To be able to use my arm like normal again; to go fishing, kayaking, and being active                [facility] PT Assessment - 06/09/21 0001       Assessment   Medical Diagnosis L TSA; Bicep Tenodesis; clavicle resection (open); Soft tissue tumor removal    Referring Provider (PT) Louis Matte    Onset Date/Surgical Date 03/22/21    Hand Dominance Right    Next MD Visit TBD    Prior Therapy History of 2 RCR to the R shoulder                           [facility] Adult PT Treatment/Exercise - 06/09/21 0001       Shoulder Exercises: Prone   Retraction AROM;Left;20 reps    Extension AROM;Left;20 reps    Horizontal ABduction 1 AROM;Left;20 reps      Shoulder Exercises: Standing   Flexion AROM;Left;20 reps    Flexion Limitations AROM L shoulder short lever flexion x20 reps       Shoulder Exercises: ROM/Strengthening   UBE (Upper Arm Bike) 4' retro; 4' fwd 120 RPM      Shoulder Exercises: Isometric Strengthening   Other Isometric Exercises step outs with green tband 2 x 10 (IR/ER)      Modalities   Modalities Vasopneumatic      Vasopneumatic   Number Minutes Vasopneumatic  10 minutes    Vasopnuematic Location  Shoulder    Vasopneumatic Pressure Low    Vasopneumatic Temperature  34deg                       PT Short Term Goals - 05/11/21 0730       PT SHORT TERM GOAL #1   Title indep with initial HEP    Baseline lacks knowledge of appropriate exercises    Time 1    Period Weeks    Status New    Target Date 05/18/21               PT Long Term Goals - 05/11/21 2030  PT LONG TERM GOAL #1   Title Independent with advanced HEP    Baseline lacks knowledege of appropriate exercises    Time 4    Period Weeks    Status New    Target Date 06/08/21      PT LONG TERM GOAL #2   Title Active shoulder flexion to 145 degrees so the patient can easily reach overhead.    Baseline AROM <100    Time 6    Period Weeks    Status New    Target Date 06/22/21      PT LONG TERM GOAL #3   Title Active ER to 70 degrees+ to allow for easily donning/doffing of apparel.    Baseline <20    Time 6    Period Weeks    Status New    Target Date 06/22/21      PT LONG TERM GOAL #4   Title Increase ROM so patient is able to reach behind back.    Baseline reaches to back of hip at illiac crest    Time 6    Period Weeks    Status New    Target Date 06/22/21      PT LONG TERM GOAL #5   Title Increase shoulder strength to a solid 4+/5 to increase stability for performance of functional activities.    Baseline not full ROM to assess    Time 6    Period Weeks    Status New    Target Date 06/22/21      Additional Long Term Goals   Additional Long Term Goals Yes      PT LONG TERM GOAL #6   Title Perform ADL's with pain not > 3/10.     Baseline avoids some ADLs    Time 6    Period Weeks    Status New    Target Date 06/22/21                   Plan - 06/09/21 0810     Clinical Impression Statement Patient presented in clinic with normal L shoulder pain. Patient guided through light AROM and light isometric training. Patient able to demonstrate increased weakness of L shoulder ER at this time. Also demonstrated L shoulder elevation with standing antigravity flexion. Normal vasopneumatic response noted following removal of the modality.    Personal Factors and Comorbidities Past/Current Experience;Time since onset of injury/illness/exacerbation    Examination-Activity Limitations Lift;Carry;Reach Overhead    Examination-Participation Restrictions Yard Work;Occupation    Stability/Clinical Decision Making Stable/Uncomplicated    Rehab Potential Good    PT Frequency 2x / week    PT Duration 6 weeks    PT Treatment/Interventions ADLs/Self Care Home Management;Cryotherapy;Electrical Stimulation;Moist Heat;Therapeutic activities;Therapeutic exercise;Neuromuscular re-education;Manual techniques;Dry needling;Vasopneumatic Device    PT Next Visit Plan Prone laying Lt shlder extension, ER, IR, scapular punches in supine - progress to with weight, wall slides with wrist cuff for eccentric motor control., wall clocks (lt hand stable)    PT Home Exercise Plan see past pt instructions.    Consulted and Agree with Plan of Care Patient             Patient will benefit from skilled therapeutic intervention in order to improve the following deficits and impairments:  Decreased mobility, Impaired tone, Increased edema, Decreased scar mobility, Impaired UE functional use, Decreased strength, Decreased activity tolerance  Visit Diagnosis: Stiffness of left shoulder, not elsewhere classified  Left shoulder pain, unspecified chronicity  Biceps tendonosis  of left shoulder     Problem List There are no problems to display  for this patient.   Marvell Fuller, PTA 06/09/2021, 8:22 AM  [facility] [address] [place], [place], [address] Phone: [phone]   Fax:  [phone]  Name: Wesley Baxter MRN: [id number] Date of Birth: 08-27-1971

## 2021-06-13 ENCOUNTER — Other Ambulatory Visit: Payer: Self-pay

## 2021-06-13 ENCOUNTER — Ambulatory Visit: Payer: 59 | Admitting: *Deleted

## 2021-06-13 DIAGNOSIS — M25612 Stiffness of left shoulder, not elsewhere classified: Secondary | ICD-10-CM

## 2021-06-13 DIAGNOSIS — M67814 Other specified disorders of tendon, left shoulder: Secondary | ICD-10-CM

## 2021-06-13 DIAGNOSIS — M25512 Pain in left shoulder: Secondary | ICD-10-CM

## 2021-06-13 NOTE — Therapy (Signed)
[facility] [address] [place], [place], [address] Phone: [phone]   Fax:  [phone]  Physical Therapy Treatment  Patient Details  Name: Wesley Baxter MRN: [id number] Date of Birth: 08-11-1971 Referring Provider (PT): Louis Matte   Encounter Date: 06/13/2021    Past Medical History:  Diagnosis Date   Allergy    Hearing loss     Past Surgical History:  Procedure Laterality Date   ROTATOR CUFF REPAIR Right    in 1998 and again in 2003    There were no vitals filed for this visit.   Subjective Assessment - 06/13/21 0953     Subjective COVID-19 screening performed upon arrival.    Limitations House hold activities    Patient Stated Goals To be able to use my arm like normal again; to go fishing, kayaking, and being active    Currently in Pain? No/denies    Pain Location Shoulder    Pain Orientation Left    Pain Descriptors / Indicators Aching    Pain Type Surgical pain    Pain Onset More than a month ago                               [facility] Adult PT Treatment/Exercise - 06/13/21 0001       Shoulder Exercises: Supine   Protraction 20 reps   punches   Flexion Left;20 reps   2x10 working on control     Shoulder Exercises: Prone   Retraction AROM;Left;20 reps    Extension AROM;Left;20 reps      Shoulder Exercises: Standing   Flexion AROM;Left;10 reps   3x10   Flexion Limitations AROM L shoulder short lever flexion 3x10 reps hand on wall      Shoulder Exercises: ROM/Strengthening   UBE (Upper Arm Bike) 4' retro; 4' fwd 90 RPM RPM      Shoulder Exercises: Isometric Strengthening   Other Isometric Exercises step outs with green tband 2 x 10 (IR/ER)      Modalities   Modalities Vasopneumatic      Vasopneumatic   Number Minutes Vasopneumatic  --    Vasopnuematic Location  --    Vasopneumatic Pressure --    Vasopneumatic Temperature  --      Manual Therapy   Manual Therapy  Passive ROM    Manual therapy comments Rythmic stab for flex/ext, ER/IR at several angles L shldr                       PT Short Term Goals - 05/11/21 0730       PT SHORT TERM GOAL #1   Title indep with initial HEP    Baseline lacks knowledge of appropriate exercises    Time 1    Period Weeks    Status New    Target Date 05/18/21               PT Long Term Goals - 05/11/21 2030       PT LONG TERM GOAL #1   Title Independent with advanced HEP    Baseline lacks knowledege of appropriate exercises    Time 4    Period Weeks    Status New    Target Date 06/08/21      PT LONG TERM GOAL #2   Title Active shoulder flexion to 145 degrees so the patient can easily reach overhead.    Baseline AROM <  100    Time 6    Period Weeks    Status New    Target Date 06/22/21      PT LONG TERM GOAL #3   Title Active ER to 70 degrees+ to allow for easily donning/doffing of apparel.    Baseline <20    Time 6    Period Weeks    Status New    Target Date 06/22/21      PT LONG TERM GOAL #4   Title Increase ROM so patient is able to reach behind back.    Baseline reaches to back of hip at illiac crest    Time 6    Period Weeks    Status New    Target Date 06/22/21      PT LONG TERM GOAL #5   Title Increase shoulder strength to a solid 4+/5 to increase stability for performance of functional activities.    Baseline not full ROM to assess    Time 6    Period Weeks    Status New    Target Date 06/22/21      Additional Long Term Goals   Additional Long Term Goals Yes      PT LONG TERM GOAL #6   Title Perform ADL's with pain not > 3/10.    Baseline avoids some ADLs    Time 6    Period Weeks    Status New    Target Date 06/22/21                   Plan - 06/13/21 1037     Clinical Impression Statement Rx focused on ROM as well as AROM and good movement patterns to decrease compensatory movements LT shldr.Used mirror for visual feedback with flexion     Personal Factors and Comorbidities Past/Current Experience;Time since onset of injury/illness/exacerbation    Examination-Activity Limitations Lift;Carry;Reach Overhead    Examination-Participation Restrictions Yard Work;Occupation    Stability/Clinical Decision Making Stable/Uncomplicated    PT Frequency 2x / week    PT Duration 6 weeks    PT Treatment/Interventions ADLs/Self Care Home Management;Cryotherapy;Electrical Stimulation;Moist Heat;Therapeutic activities;Therapeutic exercise;Neuromuscular re-education;Manual techniques;Dry needling;Vasopneumatic Device    PT Next Visit Plan Prone laying Lt shlder extension, ER, IR, scapular punches in supine - progress to with weight, wall slides with wrist cuff for eccentric motor control., wall clocks (lt hand stable)    Consulted and Agree with Plan of Care Patient             Patient will benefit from skilled therapeutic intervention in order to improve the following deficits and impairments:  Decreased mobility, Impaired tone, Increased edema, Decreased scar mobility, Impaired UE functional use, Decreased strength, Decreased activity tolerance  Visit Diagnosis: Stiffness of left shoulder, not elsewhere classified  Left shoulder pain, unspecified chronicity  Biceps tendonosis of left shoulder     Problem List There are no problems to display for this patient.   Wesley Baxter,Wesley Baxter, PTA 06/13/2021, 3:00 PM  [facility] [address] [place], [place], [address] Phone: [phone]   Fax:  [phone]  Name: Wesley Baxter MRN: [id number] Date of Birth: [birth year]

## 2021-06-15 ENCOUNTER — Encounter: Payer: Self-pay | Admitting: Physical Therapy

## 2021-06-15 ENCOUNTER — Other Ambulatory Visit: Payer: Self-pay

## 2021-06-15 ENCOUNTER — Ambulatory Visit: Payer: 59 | Admitting: Physical Therapy

## 2021-06-15 DIAGNOSIS — M67814 Other specified disorders of tendon, left shoulder: Secondary | ICD-10-CM

## 2021-06-15 DIAGNOSIS — M25612 Stiffness of left shoulder, not elsewhere classified: Secondary | ICD-10-CM

## 2021-06-15 DIAGNOSIS — M25512 Pain in left shoulder: Secondary | ICD-10-CM

## 2021-06-15 NOTE — Therapy (Signed)
[facility] [address] [place], [place], [address] Phone: [phone]   Fax:  [phone]  Physical Therapy Treatment  Patient Details  Name: Wesley Baxter MRN: [id number] Date of Birth: 06-12-1971 Referring Provider (PT): Louis Matte   Encounter Date: 06/15/2021   PT End of Session - 06/15/21 0736     Visit Number 9    Number of Visits 12    Date for PT Re-Evaluation 06/22/21    Authorization Type UHC ;FOTO at least every 5th visit    PT Start Time 0733    PT Stop Time 0814    PT Time Calculation (min) 41 min    Activity Tolerance Patient tolerated treatment well    Behavior During Therapy [facility] for tasks assessed/performed             Past Medical History:  Diagnosis Date   Allergy    Hearing loss     Past Surgical History:  Procedure Laterality Date   ROTATOR CUFF REPAIR Right    in 1998 and again in 2003    There were no vitals filed for this visit.   Subjective Assessment - 06/15/21 0735     Subjective COVID-19 screening performed upon arrival. No complaints.    Limitations House hold activities    Patient Stated Goals To be able to use my arm like normal again; to go fishing, kayaking, and being active    Currently in Pain? No/denies                [facility] PT Assessment - 06/15/21 0001       Assessment   Medical Diagnosis L TSA; Bicep Tenodesis; clavicle resection (open); Soft tissue tumor removal    Referring Provider (PT) Louis Matte    Onset Date/Surgical Date 03/22/21    Hand Dominance Right    Next MD Visit TBD    Prior Therapy History of 2 RCR to the R shoulder      Precautions   Precautions Shoulder    Type of Shoulder Precautions Limited ER and Abduction                           [facility] Adult PT Treatment/Exercise - 06/15/21 0001       Shoulder Exercises: Supine   Flexion AROM;Left;20 reps    Flexion Limitations reclined positon      Shoulder  Exercises: Sidelying   Flexion AROM;Left;15 reps      Shoulder Exercises: Standing   External Rotation Strengthening;Both;15 reps;Theraband    Theraband Level (Shoulder External Rotation) Level 3 (Green)    External Rotation Limitations step outs    Internal Rotation Strengthening;Left;15 reps;Theraband    Theraband Level (Shoulder Internal Rotation) Level 3 (Green)    Internal Rotation Limitations step outs    Flexion AROM;Left;20 reps    Flexion Limitations short lever      Shoulder Exercises: Pulleys   Flexion 3 minutes      Shoulder Exercises: ROM/Strengthening   UBE (Upper Arm Bike) 4' retro; 4' fwd 60 RPM RPM    Wall Wash CW, CW 2x10 reps, flex x10 reps                       PT Short Term Goals - 05/11/21 0730       PT SHORT TERM GOAL #1   Title indep with initial HEP    Baseline lacks knowledge of appropriate exercises  Time 1    Period Weeks    Status New    Target Date 05/18/21               PT Long Term Goals - 05/11/21 2030       PT LONG TERM GOAL #1   Title Independent with advanced HEP    Baseline lacks knowledege of appropriate exercises    Time 4    Period Weeks    Status New    Target Date 06/08/21      PT LONG TERM GOAL #2   Title Active shoulder flexion to 145 degrees so the patient can easily reach overhead.    Baseline AROM <100    Time 6    Period Weeks    Status New    Target Date 06/22/21      PT LONG TERM GOAL #3   Title Active ER to 70 degrees+ to allow for easily donning/doffing of apparel.    Baseline <20    Time 6    Period Weeks    Status New    Target Date 06/22/21      PT LONG TERM GOAL #4   Title Increase ROM so patient is able to reach behind back.    Baseline reaches to back of hip at illiac crest    Time 6    Period Weeks    Status New    Target Date 06/22/21      PT LONG TERM GOAL #5   Title Increase shoulder strength to a solid 4+/5 to increase stability for performance of functional  activities.    Baseline not full ROM to assess    Time 6    Period Weeks    Status New    Target Date 06/22/21      Additional Long Term Goals   Additional Long Term Goals Yes      PT LONG TERM GOAL #6   Title Perform ADL's with pain not > 3/10.    Baseline avoids some ADLs    Time 6    Period Weeks    Status New    Target Date 06/22/21                   Plan - 06/15/21 0824     Clinical Impression Statement Patient presented in clinic with some weakness notable in shoulder flexion especially long lever. Patient progressed to more antigravity exercises but muscle fatigue reported throughout reclined and SL exercises.    Personal Factors and Comorbidities Past/Current Experience;Time since onset of injury/illness/exacerbation    Examination-Activity Limitations Lift;Carry;Reach Overhead    Examination-Participation Restrictions Yard Work;Occupation    Stability/Clinical Decision Making Stable/Uncomplicated    Rehab Potential Good    PT Frequency 2x / week    PT Duration 6 weeks    PT Treatment/Interventions ADLs/Self Care Home Management;Cryotherapy;Electrical Stimulation;Moist Heat;Therapeutic activities;Therapeutic exercise;Neuromuscular re-education;Manual techniques;Dry needling;Vasopneumatic Device    PT Next Visit Plan Prone laying Lt shlder extension, ER, IR, scapular punches in supine - progress to with weight, wall slides with wrist cuff for eccentric motor control., wall clocks (lt hand stable)    PT Home Exercise Plan see past pt instructions.    Consulted and Agree with Plan of Care Patient             Patient will benefit from skilled therapeutic intervention in order to improve the following deficits and impairments:  Decreased mobility, Impaired tone, Increased edema, Decreased scar mobility, Impaired UE functional  use, Decreased strength, Decreased activity tolerance  Visit Diagnosis: Stiffness of left shoulder, not elsewhere classified  Left  shoulder pain, unspecified chronicity  Biceps tendonosis of left shoulder     Problem List There are no problems to display for this patient.   Marvell Fuller, PTA 06/15/2021, 8:26 AM  [facility] [address] [place], [place], [address] Phone: [phone]   Fax:  [phone]  Name: Vega Withrow MRN: [id number] Date of Birth: [birth year]

## 2021-06-19 ENCOUNTER — Ambulatory Visit: Payer: 59 | Admitting: Physical Therapy

## 2021-06-19 ENCOUNTER — Encounter: Payer: Self-pay | Admitting: Physical Therapy

## 2021-06-19 ENCOUNTER — Other Ambulatory Visit: Payer: Self-pay

## 2021-06-19 DIAGNOSIS — M25612 Stiffness of left shoulder, not elsewhere classified: Secondary | ICD-10-CM

## 2021-06-19 DIAGNOSIS — M25512 Pain in left shoulder: Secondary | ICD-10-CM

## 2021-06-19 DIAGNOSIS — M67814 Other specified disorders of tendon, left shoulder: Secondary | ICD-10-CM

## 2021-06-19 NOTE — Therapy (Addendum)
[facility] [address] [place], [place], [address] Phone: [phone]   Fax:  [phone]  Physical Therapy Treatment  Patient Details  Name: Wesley Baxter MRN: [id number] Date of Birth: 12/16/1970 Referring Provider (PT): Louis Matte   Encounter Date: 06/19/2021   PT End of Session - 06/19/21 0831     Visit Number 10    Number of Visits 12    Date for PT Re-Evaluation 06/22/21    Authorization Type UHC ;FOTO at least every 5th visit    PT Start Time 0735    PT Stop Time 0811   late arrival and no modalities   PT Time Calculation (min) 36 min    Activity Tolerance Patient tolerated treatment well    Behavior During Therapy [facility] for tasks assessed/performed             Past Medical History:  Diagnosis Date   Allergy    Hearing loss     Past Surgical History:  Procedure Laterality Date   ROTATOR CUFF REPAIR Right    in 1998 and again in 2003    There were no vitals filed for this visit.   Subjective Assessment - 06/19/21 0738     Subjective COVID-19 screening performed upon arrival. Reports popping of his shoulder in scapula region.    Limitations House hold activities    Patient Stated Goals To be able to use my arm like normal again; to go fishing, kayaking, and being active    Currently in Pain? No/denies                [facility] PT Assessment - 06/19/21 0001       Assessment   Medical Diagnosis L TSA; Bicep Tenodesis; clavicle resection (open); Soft tissue tumor removal    Referring Provider (PT) Louis Matte    Onset Date/Surgical Date 03/22/21    Hand Dominance Right    Next MD Visit TBD    Prior Therapy History of 2 RCR to the R shoulder      Precautions   Precautions Shoulder    Type of Shoulder Precautions Limited ER and Abduction                           [facility] Adult PT Treatment/Exercise - 06/19/21 0001       Shoulder Exercises: Supine   Protraction  AROM;Left;20 reps    Protraction Limitations reclined    Flexion AROM;Left;20 reps    Flexion Limitations reclined positon    ABduction AROM;Left;20 reps    ABduction Limitations short lever; reclined      Shoulder Exercises: Prone   Retraction AROM;Left;20 reps    Extension AROM;Left;20 reps      Shoulder Exercises: Sidelying   Flexion AROM;Left;20 reps      Shoulder Exercises: Standing   External Rotation Strengthening;Both;15 reps;Theraband    Theraband Level (Shoulder External Rotation) Level 3 (Green)    External Rotation Limitations step outs    Internal Rotation Strengthening;Left;15 reps;Theraband    Theraband Level (Shoulder Internal Rotation) Level 3 (Green)    Internal Rotation Limitations step outs    Flexion AROM;Left;20 reps    Flexion Limitations short lever    Other Standing Exercises LUE cone to cabinet (mid and high) x10 reps each      Shoulder Exercises: Pulleys   Flexion 3 minutes      Shoulder Exercises: ROM/Strengthening   UBE (Upper Arm Bike) 4' retro; 4' fwd  60 RPM RPM    Wall Wash CW, CW x15 reps    Wall Pushups 20 reps    Other ROM/Strengthening Exercises LUE wall clocks x5 reps                       PT Short Term Goals - 05/11/21 0730       PT SHORT TERM GOAL #1   Title indep with initial HEP    Baseline lacks knowledge of appropriate exercises    Time 1    Period Weeks    Status New    Target Date 05/18/21               PT Long Term Goals - 05/11/21 2030       PT LONG TERM GOAL #1   Title Independent with advanced HEP    Baseline lacks knowledege of appropriate exercises    Time 4    Period Weeks    Status New    Target Date 06/08/21      PT LONG TERM GOAL #2   Title Active shoulder flexion to 145 degrees so the patient can easily reach overhead.    Baseline AROM <100    Time 6    Period Weeks    Status New    Target Date 06/22/21      PT LONG TERM GOAL #3   Title Active ER to 70 degrees+ to allow for  easily donning/doffing of apparel.    Baseline <20    Time 6    Period Weeks    Status New    Target Date 06/22/21      PT LONG TERM GOAL #4   Title Increase ROM so patient is able to reach behind back.    Baseline reaches to back of hip at illiac crest    Time 6    Period Weeks    Status New    Target Date 06/22/21      PT LONG TERM GOAL #5   Title Increase shoulder strength to a solid 4+/5 to increase stability for performance of functional activities.    Baseline not full ROM to assess    Time 6    Period Weeks    Status New    Target Date 06/22/21      Additional Long Term Goals   Additional Long Term Goals Yes      PT LONG TERM GOAL #6   Title Perform ADL's with pain not > 3/10.    Baseline avoids some ADLs    Time 6    Period Weeks    Status New    Target Date 06/22/21                   Plan - 06/19/21 3762     Clinical Impression Statement Patient presented in clinic with reports of popping in L shoulder. Throughout today's therex, VCs and monitoring provided of scapula retraction and movement. Continued muscle weakness and fatigue notable throughout therex.    Personal Factors and Comorbidities Past/Current Experience;Time since onset of injury/illness/exacerbation    Examination-Activity Limitations Lift;Carry;Reach Overhead    Examination-Participation Restrictions Yard Work;Occupation    Stability/Clinical Decision Making Stable/Uncomplicated    Rehab Potential Good    PT Frequency 2x / week    PT Duration 6 weeks    PT Treatment/Interventions ADLs/Self Care Home Management;Cryotherapy;Electrical Stimulation;Moist Heat;Therapeutic activities;Therapeutic exercise;Neuromuscular re-education;Manual techniques;Dry needling;Vasopneumatic Device    PT Next Visit Plan Prone laying  Lt shlder extension, ER, IR, scapular punches in supine - progress to with weight, wall slides with wrist cuff for eccentric motor control., wall clocks (lt hand stable)    PT  Home Exercise Plan see past pt instructions.    Consulted and Agree with Plan of Care Patient             Patient will benefit from skilled therapeutic intervention in order to improve the following deficits and impairments:  Decreased mobility, Impaired tone, Increased edema, Decreased scar mobility, Impaired UE functional use, Decreased strength, Decreased activity tolerance  Visit Diagnosis: Stiffness of left shoulder, not elsewhere classified  Left shoulder pain, unspecified chronicity  Biceps tendonosis of left shoulder     Problem List There are no problems to display for this patient.   Marvell Fuller, PTA 06/19/2021, 8:37 AM  [facility] [address] [place], [place], [address] Phone: [phone]   Fax:  [phone]  Name: Wesley Baxter MRN: [id number] Date of Birth: 03-01-1971  Progress Note Reporting Period 05/11/21 to 06/19/21.  See note below for Objective Data and Assessment of Progress/Goals.  Goals remain ongoing at this time.    [place] App[place] MPT

## 2021-06-21 ENCOUNTER — Other Ambulatory Visit: Payer: Self-pay

## 2021-06-21 ENCOUNTER — Encounter: Payer: Self-pay | Admitting: Physical Therapy

## 2021-06-21 ENCOUNTER — Ambulatory Visit: Payer: 59 | Admitting: Physical Therapy

## 2021-06-21 DIAGNOSIS — M25612 Stiffness of left shoulder, not elsewhere classified: Secondary | ICD-10-CM | POA: Diagnosis not present

## 2021-06-21 DIAGNOSIS — M67814 Other specified disorders of tendon, left shoulder: Secondary | ICD-10-CM

## 2021-06-21 DIAGNOSIS — M25512 Pain in left shoulder: Secondary | ICD-10-CM

## 2021-06-21 NOTE — Therapy (Signed)
[facility] [address] [place], [place], [address] Phone: [phone]   Fax:  [phone]  Physical Therapy Treatment  Patient Details  Name: Wesley Baxter MRN: [id number] Date of Birth: 1970-10-26 Referring Provider (PT): Louis Matte   Encounter Date: 06/21/2021   PT End of Session - 06/21/21 0739     Visit Number 11    Number of Visits 12    Date for PT Re-Evaluation 06/22/21    Authorization Type UHC ;FOTO at least every 5th visit    PT Start Time 0733    PT Stop Time 0814    PT Time Calculation (min) 41 min    Activity Tolerance Patient tolerated treatment well    Behavior During Therapy [facility] for tasks assessed/performed             Past Medical History:  Diagnosis Date   Allergy    Hearing loss     Past Surgical History:  Procedure Laterality Date   ROTATOR CUFF REPAIR Right    in 1998 and again in 2003    There were no vitals filed for this visit.   Subjective Assessment - 06/21/21 0737     Subjective COVID-19 screening performed upon arrival.    Limitations House hold activities    Patient Stated Goals To be able to use my arm like normal again; to go fishing, kayaking, and being active    Currently in Pain? No/denies                [facility] PT Assessment - 06/21/21 0001       Assessment   Medical Diagnosis L TSA; Bicep Tenodesis; clavicle resection (open); Soft tissue tumor removal    Referring Provider (PT) Louis Matte    Onset Date/Surgical Date 03/22/21    Hand Dominance Right    Next MD Visit TBD    Prior Therapy History of 2 RCR to the R shoulder      Precautions   Precautions Shoulder    Type of Shoulder Precautions Limited ER and Abduction                           [facility] Adult PT Treatment/Exercise - 06/21/21 0001       Shoulder Exercises: Supine   Protraction AROM;Left;20 reps    Protraction Limitations reclined    Flexion AROM;Left;20 reps     Flexion Limitations reclined positon; short and long lever    Other Supine Exercises LUE ABCs x1 rep      Shoulder Exercises: Sidelying   External Rotation AROM;Left;20 reps    Flexion AROM;Left;15 reps      Shoulder Exercises: Standing   External Rotation Strengthening;Both;15 reps;Theraband    Theraband Level (Shoulder External Rotation) Level 3 (Green)    External Rotation Limitations step outs    Internal Rotation Strengthening;Left;15 reps;Theraband    Theraband Level (Shoulder Internal Rotation) Level 3 (Green)    Internal Rotation Limitations step outs    Extension Strengthening;Left;20 reps    Theraband Level (Shoulder Extension) Level 3 (Green)    Row Strengthening;Left;20 reps;Theraband    Theraband Level (Shoulder Row) Level 3 (Green)      Shoulder Exercises: Pulleys   Flexion 3 minutes      Shoulder Exercises: ROM/Strengthening   UBE (Upper Arm Bike) 3' retro; 3' fwd 60 RPM RPM    Wall Wash CW, CW 2x15 reps    Wall Pushups 20 reps    Other  ROM/Strengthening Exercises LUE wall clocks x10 reps                       PT Short Term Goals - 05/11/21 0730       PT SHORT TERM GOAL #1   Title indep with initial HEP    Baseline lacks knowledge of appropriate exercises    Time 1    Period Weeks    Status New    Target Date 05/18/21               PT Long Term Goals - 05/11/21 2030       PT LONG TERM GOAL #1   Title Independent with advanced HEP    Baseline lacks knowledege of appropriate exercises    Time 4    Period Weeks    Status New    Target Date 06/08/21      PT LONG TERM GOAL #2   Title Active shoulder flexion to 145 degrees so the patient can easily reach overhead.    Baseline AROM <100    Time 6    Period Weeks    Status New    Target Date 06/22/21      PT LONG TERM GOAL #3   Title Active ER to 70 degrees+ to allow for easily donning/doffing of apparel.    Baseline <20    Time 6    Period Weeks    Status New    Target Date  06/22/21      PT LONG TERM GOAL #4   Title Increase ROM so patient is able to reach behind back.    Baseline reaches to back of hip at illiac crest    Time 6    Period Weeks    Status New    Target Date 06/22/21      PT LONG TERM GOAL #5   Title Increase shoulder strength to a solid 4+/5 to increase stability for performance of functional activities.    Baseline not full ROM to assess    Time 6    Period Weeks    Status New    Target Date 06/22/21      Additional Long Term Goals   Additional Long Term Goals Yes      PT LONG TERM GOAL #6   Title Perform ADL's with pain not > 3/10.    Baseline avoids some ADLs    Time 6    Period Weeks    Status New    Target Date 06/22/21                   Plan - 06/21/21 0828     Clinical Impression Statement Patient presented in clinic with no complaints regarding L shoulder with recreational activities such as fishing. Patient able to tolerate progression of resistance and VCs for scapular retraction throughout therex. Muscle fatigue is still very prominent with reclined position.    Personal Factors and Comorbidities Past/Current Experience;Time since onset of injury/illness/exacerbation    Examination-Activity Limitations Lift;Carry;Reach Overhead    Examination-Participation Restrictions Yard Work;Occupation    Stability/Clinical Decision Making Stable/Uncomplicated    Rehab Potential Good    PT Frequency 2x / week    PT Duration 6 weeks    PT Treatment/Interventions ADLs/Self Care Home Management;Cryotherapy;Electrical Stimulation;Moist Heat;Therapeutic activities;Therapeutic exercise;Neuromuscular re-education;Manual techniques;Dry needling;Vasopneumatic Device    PT Next Visit Plan Prone laying Lt shlder extension, ER, IR, scapular punches in supine - progress to with weight, wall  slides with wrist cuff for eccentric motor control., wall clocks (lt hand stable)    PT Home Exercise Plan see past pt instructions.     Consulted and Agree with Plan of Care Patient             Patient will benefit from skilled therapeutic intervention in order to improve the following deficits and impairments:  Decreased mobility, Impaired tone, Increased edema, Decreased scar mobility, Impaired UE functional use, Decreased strength, Decreased activity tolerance  Visit Diagnosis: Stiffness of left shoulder, not elsewhere classified  Left shoulder pain, unspecified chronicity  Biceps tendonosis of left shoulder     Problem List There are no problems to display for this patient.   Marvell Fuller, PTA 06/21/2021, 8:33 AM  [facility] [address] [place], [place], [address] Phone: [phone]   Fax:  [phone]  Name: Wesley Baxter MRN: [id number] Date of Birth: [birth year]

## 2021-06-26 ENCOUNTER — Encounter: Payer: Self-pay | Admitting: Physical Therapy

## 2021-06-26 ENCOUNTER — Ambulatory Visit: Payer: 59 | Admitting: Physical Therapy

## 2021-06-26 ENCOUNTER — Other Ambulatory Visit: Payer: Self-pay

## 2021-06-26 DIAGNOSIS — M25612 Stiffness of left shoulder, not elsewhere classified: Secondary | ICD-10-CM | POA: Diagnosis not present

## 2021-06-26 DIAGNOSIS — M67814 Other specified disorders of tendon, left shoulder: Secondary | ICD-10-CM

## 2021-06-26 DIAGNOSIS — M25512 Pain in left shoulder: Secondary | ICD-10-CM

## 2021-06-26 NOTE — Therapy (Signed)
[facility] [address] [place], [place], [address] Phone: [phone]   Fax:  [phone]  Physical Therapy Treatment  Patient Details  Name: Wesley Baxter MRN: [id number] Date of Birth: 1971-02-25 Referring Provider (PT): Louis Matte   Encounter Date: 06/26/2021   PT End of Session - 06/26/21 0822     Visit Number 12    Number of Visits 12    Date for PT Re-Evaluation 06/22/21    Authorization Type UHC ;FOTO at least every 5th visit    PT Start Time 0816    PT Stop Time 0859    PT Time Calculation (min) 43 min    Activity Tolerance Patient tolerated treatment well    Behavior During Therapy [facility] for tasks assessed/performed             Past Medical History:  Diagnosis Date   Allergy    Hearing loss     Past Surgical History:  Procedure Laterality Date   ROTATOR CUFF REPAIR Right    in 1998 and again in 2003    There were no vitals filed for this visit.   Subjective Assessment - 06/26/21 0821     Subjective COVID-19 screening performed upon arrival. No pain or difficulties upon arrival.    Limitations House hold activities    Patient Stated Goals To be able to use my arm like normal again; to go fishing, kayaking, and being active    Currently in Pain? No/denies                [facility] PT Assessment - 06/26/21 0001       Assessment   Medical Diagnosis L TSA; Bicep Tenodesis; clavicle resection (open); Soft tissue tumor removal    Referring Provider (PT) Louis Matte    Onset Date/Surgical Date 03/22/21    Hand Dominance Right    Next MD Visit TBD    Prior Therapy History of 2 RCR to the R shoulder      Precautions   Precautions Shoulder    Type of Shoulder Precautions Limited ER and Abduction      Observation/Other Assessments   Focus on Therapeutic Outcomes (FOTO)  53% limitation      ROM / Strength   AROM / PROM / Strength AROM      AROM   Overall AROM  Within functional limits  for tasks performed    AROM Assessment Site Shoulder    Right/Left Shoulder Left    Left Shoulder Flexion 143 Degrees    Left Shoulder Internal Rotation 65 Degrees    Left Shoulder External Rotation 55 Degrees                           [facility] Adult PT Treatment/Exercise - 06/26/21 0001       Shoulder Exercises: Supine   Flexion Strengthening;Left;10 reps;Weights    Shoulder Flexion Weight (lbs) 2      Shoulder Exercises: Prone   Retraction Strengthening;Left;20 reps;Weights    Retraction Weight (lbs) 2    Flexion Strengthening;Left;20 reps;Weights    Flexion Weight (lbs) 2    Extension Strengthening;Left;20 reps;Weights    Extension Weight (lbs) 2    Horizontal ABduction 1 Strengthening;Left;20 reps;Weights    Horizontal ABduction 1 Weight (lbs) 2      Shoulder Exercises: Standing   External Rotation Strengthening;Left;20 reps;Theraband    Theraband Level (Shoulder External Rotation) Level 2 (Red)    Internal Rotation Strengthening;Left;20 reps;Theraband  Theraband Level (Shoulder Internal Rotation) Level 2 (Red)    Flexion AROM;Left;20 reps    ABduction AROM;Left;20 reps    Extension Strengthening;Left;20 reps;Theraband    Theraband Level (Shoulder Extension) Level 2 (Red)    Row Strengthening;Left;20 reps;Theraband    Theraband Level (Shoulder Row) Level 2 (Red)    Other Standing Exercises L shoulder short lever flexion 2# x20 reps    Other Standing Exercises L shoulder scaption 1# x20 reps; wall slides with eccentric lowering x15 reps      Shoulder Exercises: Pulleys   Flexion 5 minutes      Shoulder Exercises: ROM/Strengthening   UBE (Upper Arm Bike) 60 RPM x8 min (forward, backward)    Wall Wash CW, CW x10 reps 1#    Wall Pushups 20 reps                       PT Short Term Goals - 06/26/21 1348       PT SHORT TERM GOAL #1   Title indep with initial HEP    Baseline lacks knowledge of appropriate exercises    Time 1    Period  Weeks    Status On-going    Target Date 05/18/21               PT Long Term Goals - 06/26/21 1348       PT LONG TERM GOAL #1   Title Independent with advanced HEP    Baseline lacks knowledege of appropriate exercises    Time 4    Period Weeks    Status On-going      PT LONG TERM GOAL #2   Title Active shoulder flexion to 145 degrees so the patient can easily reach overhead.    Baseline AROM <100    Time 6    Period Weeks    Status On-going      PT LONG TERM GOAL #3   Title Active ER to 70 degrees+ to allow for easily donning/doffing of apparel.    Baseline <20    Time 6    Period Weeks    Status On-going      PT LONG TERM GOAL #4   Title Increase ROM so patient is able to reach behind back.    Baseline reaches to back of hip at illiac crest    Time 6    Period Weeks    Status On-going      PT LONG TERM GOAL #5   Title Increase shoulder strength to a solid 4+/5 to increase stability for performance of functional activities.    Baseline not full ROM to assess    Time 6    Period Weeks    Status On-going      PT LONG TERM GOAL #6   Title Perform ADL's with pain not > 3/10.    Baseline avoids some ADLs    Time 6    Period Weeks    Status Achieved                   Plan - 06/26/21 1305     Clinical Impression Statement Patient presented in clinic with no complaints of pain but some weakness and difficulty with concentric and ecentric shoulder flexion. Patient progressed in regards to strengthening with VCs for scapular retraction throughout. Patient states that sometimes he has small pops in the posterior shoulder and that the L shoulder "doesn't feel the same." Patient instructed to begin wall slides  with eccentric lowering at home. Increased effort required for AROM measurement of L shoulder flexion.    Personal Factors and Comorbidities Past/Current Experience;Time since onset of injury/illness/exacerbation    Examination-Activity Limitations  Lift;Carry;Reach Overhead    Examination-Participation Restrictions Yard Work;Occupation    Stability/Clinical Decision Making Stable/Uncomplicated    Rehab Potential Good    PT Frequency 2x / week    PT Duration 6 weeks    PT Treatment/Interventions ADLs/Self Care Home Management;Cryotherapy;Electrical Stimulation;Moist Heat;Therapeutic activities;Therapeutic exercise;Neuromuscular re-education;Manual techniques;Dry needling;Vasopneumatic Device    PT Next Visit Plan Prone laying Lt shlder extension, ER, IR, scapular punches in supine - progress to with weight, wall slides with wrist cuff for eccentric motor control., wall clocks (lt hand stable)    PT Home Exercise Plan see past pt instructions.    Consulted and Agree with Plan of Care Patient             Patient will benefit from skilled therapeutic intervention in order to improve the following deficits and impairments:  Decreased mobility, Impaired tone, Increased edema, Decreased scar mobility, Impaired UE functional use, Decreased strength, Decreased activity tolerance  Visit Diagnosis: Stiffness of left shoulder, not elsewhere classified  Left shoulder pain, unspecified chronicity  Biceps tendonosis of left shoulder     Problem List There are no problems to display for this patient.   Marvell Fuller, PTA 06/26/2021, 1:49 PM  [facility] [address] [place], [place], [address] Phone: [phone]   Fax:  [phone]  Name: Gunther Zawadzki MRN: [id number] Date of Birth: [birth year]

## 2021-06-28 ENCOUNTER — Ambulatory Visit: Payer: 59 | Admitting: Physical Therapy

## 2021-06-28 ENCOUNTER — Other Ambulatory Visit: Payer: Self-pay

## 2021-06-28 ENCOUNTER — Encounter: Payer: Self-pay | Admitting: Physical Therapy

## 2021-06-28 DIAGNOSIS — M25512 Pain in left shoulder: Secondary | ICD-10-CM

## 2021-06-28 DIAGNOSIS — M67814 Other specified disorders of tendon, left shoulder: Secondary | ICD-10-CM

## 2021-06-28 DIAGNOSIS — M25612 Stiffness of left shoulder, not elsewhere classified: Secondary | ICD-10-CM

## 2021-06-28 NOTE — Therapy (Signed)
[facility] [address] [place], [place], [address] Phone: [phone]   Fax:  [phone]  Physical Therapy Treatment  Patient Details  Name: Wesley Baxter MRN: [id number] Date of Birth: Sep 04, 1971 Referring Provider (PT): Louis Matte   Encounter Date: 06/28/2021   PT End of Session - 06/28/21 0822     Visit Number 13    Number of Visits 12    Date for PT Re-Evaluation 06/22/21    Authorization Type UHC ;FOTO at least every 5th visit    PT Start Time 0819    PT Stop Time 0858    PT Time Calculation (min) 39 min    Activity Tolerance Patient tolerated treatment well    Behavior During Therapy [facility] for tasks assessed/performed             Past Medical History:  Diagnosis Date   Allergy    Hearing loss     Past Surgical History:  Procedure Laterality Date   ROTATOR CUFF REPAIR Right    in 1998 and again in 2003    There were no vitals filed for this visit.   Subjective Assessment - 06/28/21 0822     Subjective COVID-19 screening performed upon arrival. No pain or difficulties upon arrival.    Limitations House hold activities    Patient Stated Goals To be able to use my arm like normal again; to go fishing, kayaking, and being active    Currently in Pain? No/denies                [facility] PT Assessment - 06/28/21 0001       Assessment   Medical Diagnosis L TSA; Bicep Tenodesis; clavicle resection (open); Soft tissue tumor removal    Referring Provider (PT) Louis Matte    Onset Date/Surgical Date 03/22/21    Hand Dominance Right    Next MD Visit 07/18/2021    Prior Therapy History of 2 RCR to the R shoulder      Precautions   Precautions Shoulder    Type of Shoulder Precautions Limited ER and Abduction                           [facility] Adult PT Treatment/Exercise - 06/28/21 0001       Shoulder Exercises: Supine   Flexion Strengthening;Left;20 reps;Weights    Shoulder  Flexion Weight (lbs) 1      Shoulder Exercises: Prone   Retraction Strengthening;Left;20 reps;Weights    Retraction Weight (lbs) 3    Flexion Strengthening;Left;20 reps;Weights    Flexion Weight (lbs) 3    Extension Strengthening;Left;20 reps;Weights    Extension Weight (lbs) 3      Shoulder Exercises: Sidelying   External Rotation AROM;Left;20 reps      Shoulder Exercises: Standing   External Rotation Strengthening;Both;Theraband;20 reps    Theraband Level (Shoulder External Rotation) Level 2 (Red)    External Rotation Limitations with OH flexion yellow theraband x10 reps    Internal Rotation Strengthening;Left;20 reps;Theraband    Theraband Level (Shoulder Internal Rotation) Level 2 (Red)    Flexion Strengthening;Left;20 reps;Weights    Shoulder Flexion Weight (lbs) 1    Extension Strengthening;Left;20 reps;Theraband    Theraband Level (Shoulder Extension) Level 3 (Green)    Row Strengthening;Left;20 reps;Theraband    Theraband Level (Shoulder Row) Level 3 (Green)    Other Standing Exercises L shoulder short lever flexion 3# x20 reps    Other Standing Exercises L shoulder  scaption 1# x20 reps;      Shoulder Exercises: Pulleys   Flexion 5 minutes      Shoulder Exercises: ROM/Strengthening   UBE (Upper Arm Bike) 60 RPM x8 min (forward, backward)                     PT Education - 06/28/21 0853     Education Details [id number]    Person(s) Educated Patient    Methods Explanation;Demonstration;Handout    Comprehension Verbalized understanding              PT Short Term Goals - 06/28/21 0853       PT SHORT TERM GOAL #1   Title indep with initial HEP    Baseline lacks knowledge of appropriate exercises    Time 1    Period Weeks    Status Achieved    Target Date 05/18/21               PT Long Term Goals - 06/28/21 0857       PT LONG TERM GOAL #1   Title Independent with advanced HEP    Baseline lacks knowledege of appropriate exercises     Time 4    Period Weeks    Status On-going      PT LONG TERM GOAL #2   Title Active shoulder flexion to 145 degrees so the patient can easily reach overhead.    Baseline AROM <100    Time 6    Period Weeks    Status On-going      PT LONG TERM GOAL #3   Title Active ER to 70 degrees+ to allow for easily donning/doffing of apparel.    Baseline <20    Time 6    Period Weeks    Status On-going      PT LONG TERM GOAL #4   Title Increase ROM so patient is able to reach behind back.    Baseline reaches to back of hip at illiac crest    Time 6    Period Weeks    Status Achieved   L1-L2 06/28/2021     PT LONG TERM GOAL #5   Title Increase shoulder strength to a solid 4+/5 to increase stability for performance of functional activities.    Baseline not full ROM to assess    Time 6    Period Weeks    Status On-going      PT LONG TERM GOAL #6   Title Perform ADL's with pain not > 3/10.    Baseline avoids some ADLs    Time 6    Period Weeks    Status Achieved                   Plan - 06/28/21 0957     Clinical Impression Statement Patient presented in clinic with reports of popping of L scapula with ecentric returns from wall slide. Only minimal popping sensation reported during PT session today but felt if palpating L scapula. Patient able to tolerate all strengthening well but continues to fatigue quickly especially with antigravity flexion. Patient provided new HEP for light strengthening and provided red and green theraband with instructions.    Personal Factors and Comorbidities Past/Current Experience;Time since onset of injury/illness/exacerbation    Examination-Activity Limitations Lift;Carry;Reach Overhead    Examination-Participation Restrictions Yard Work;Occupation    Stability/Clinical Decision Making Stable/Uncomplicated    Rehab Potential Good    PT Frequency 2x / week  PT Duration 6 weeks    PT Treatment/Interventions ADLs/Self Care Home  Management;Cryotherapy;Electrical Stimulation;Moist Heat;Therapeutic activities;Therapeutic exercise;Neuromuscular re-education;Manual techniques;Dry needling;Vasopneumatic Device    PT Next Visit Plan Prone laying Lt shlder extension, ER, IR, scapular punches in supine - progress to with weight, wall slides with wrist cuff for eccentric motor control., wall clocks (lt hand stable)    PT Home Exercise Plan see past pt instructions.    Consulted and Agree with Plan of Care Patient             Patient will benefit from skilled therapeutic intervention in order to improve the following deficits and impairments:  Decreased mobility, Impaired tone, Increased edema, Decreased scar mobility, Impaired UE functional use, Decreased strength, Decreased activity tolerance  Visit Diagnosis: Stiffness of left shoulder, not elsewhere classified  Left shoulder pain, unspecified chronicity  Biceps tendonosis of left shoulder     Problem List There are no problems to display for this patient.   Marvell Fuller, PTA 06/28/2021, 12:12 PM  [facility] [address] [place], [place], [address] Phone: [phone]   Fax:  [phone]  Name: Wesley Baxter MRN: [id number] Date of Birth: 04-Jul-1971

## 2021-07-03 ENCOUNTER — Encounter: Payer: Self-pay | Admitting: Physical Therapy

## 2021-07-03 ENCOUNTER — Other Ambulatory Visit: Payer: Self-pay

## 2021-07-03 ENCOUNTER — Ambulatory Visit: Payer: 59 | Attending: Sports Medicine | Admitting: Physical Therapy

## 2021-07-03 DIAGNOSIS — M25512 Pain in left shoulder: Secondary | ICD-10-CM | POA: Insufficient documentation

## 2021-07-03 DIAGNOSIS — M25612 Stiffness of left shoulder, not elsewhere classified: Secondary | ICD-10-CM | POA: Insufficient documentation

## 2021-07-03 DIAGNOSIS — M67814 Other specified disorders of tendon, left shoulder: Secondary | ICD-10-CM | POA: Insufficient documentation

## 2021-07-03 NOTE — Therapy (Signed)
[facility] [address] [place], [place], [address] Phone: [phone]   Fax:  [phone]  Physical Therapy Treatment  Patient Details  Name: Wesley Baxter MRN: [id number] Date of Birth: Jan 18, 1971 Referring Provider (PT): Louis Matte   Encounter Date: 07/03/2021   PT End of Session - 07/03/21 0818     Visit Number 14    Number of Visits 12    Date for PT Re-Evaluation 06/22/21    Authorization Type UHC ;FOTO at least every 5th visit    PT Start Time 0815    PT Stop Time 0848    PT Time Calculation (min) 33 min    Activity Tolerance Patient tolerated treatment well    Behavior During Therapy [facility] for tasks assessed/performed             Past Medical History:  Diagnosis Date   Allergy    Hearing loss     Past Surgical History:  Procedure Laterality Date   ROTATOR CUFF REPAIR Right    in 1998 and again in 2003    There were no vitals filed for this visit.   Subjective Assessment - 07/03/21 0816     Subjective COVID-19 screening performed upon arrival. Reports he worked diligently on shoulder exercises this weekend and shoulder is already fatigued. States that he can feel a thumping sound with eccentric wall slides from HEP.    Limitations House hold activities    Patient Stated Goals To be able to use my arm like normal again; to go fishing, kayaking, and being active    Currently in Pain? No/denies                [facility] PT Assessment - 07/03/21 0001       Assessment   Medical Diagnosis L TSA; Bicep Tenodesis; clavicle resection (open); Soft tissue tumor removal    Referring Provider (PT) Louis Matte    Onset Date/Surgical Date 03/22/21    Hand Dominance Right    Next MD Visit 07/18/2021    Prior Therapy History of 2 RCR to the R shoulder      Precautions   Precautions Shoulder    Type of Shoulder Precautions Limited ER and Abduction      AROM   Overall AROM  Within functional limits for  tasks performed    AROM Assessment Site Shoulder    Right/Left Shoulder Left    Left Shoulder Flexion 130 Degrees    Left Shoulder Internal Rotation 70 Degrees    Left Shoulder External Rotation 50 Degrees                           [facility] Adult PT Treatment/Exercise - 07/03/21 0001       Shoulder Exercises: Standing   Protraction Strengthening;Left;20 reps;Theraband    Theraband Level (Shoulder Protraction) Level 3 (Green)    Set designer;Theraband;20 reps;Left    Theraband Level (Shoulder External Rotation) Level 3 (Green)    Internal Rotation Strengthening;Left;20 reps;Theraband    Theraband Level (Shoulder Internal Rotation) Level 3 (Green)    Flexion Strengthening;Left;20 reps;Weights    Shoulder Flexion Weight (lbs) 1    ABduction Strengthening;Left;20 reps;Weights    Shoulder ABduction Weight (lbs) 1    Extension Strengthening;Left;20 reps;Theraband    Theraband Level (Shoulder Extension) Level 3 (Green)    Row Strengthening;Left;20 reps;Theraband    Theraband Level (Shoulder Row) Level 3 (Green)    Other Standing Exercises L shoulder  scaption 1# x20 reps;      Shoulder Exercises: Pulleys   Flexion 5 minutes      Shoulder Exercises: ROM/Strengthening   UBE (Upper Arm Bike) 60 RPM x8 min (forward, backward)    Wall Pushups 20 reps                       PT Short Term Goals - 06/28/21 1696       PT SHORT TERM GOAL #1   Title indep with initial HEP    Baseline lacks knowledge of appropriate exercises    Time 1    Period Weeks    Status Achieved    Target Date 05/18/21               PT Long Term Goals - 07/03/21 0844       PT LONG TERM GOAL #1   Title Independent with advanced HEP    Baseline lacks knowledege of appropriate exercises    Time 4    Period Weeks    Status Achieved      PT LONG TERM GOAL #2   Title Active shoulder flexion to 145 degrees so the patient can easily reach overhead.    Baseline  AROM <100    Time 6    Period Weeks    Status On-going      PT LONG TERM GOAL #3   Title Active ER to 70 degrees+ to allow for easily donning/doffing of apparel.    Baseline <20    Time 6    Period Weeks    Status On-going      PT LONG TERM GOAL #4   Title Increase ROM so patient is able to reach behind back.    Baseline reaches to back of hip at illiac crest    Time 6    Period Weeks    Status Achieved   L1-L2 06/28/2021     PT LONG TERM GOAL #5   Title Increase shoulder strength to a solid 4+/5 to increase stability for performance of functional activities.    Baseline not full ROM to assess    Time 6    Period Weeks    Status On-going      PT LONG TERM GOAL #6   Title Perform ADL's with pain not > 3/10.    Baseline avoids some ADLs    Time 6    Period Weeks    Status Achieved                   Plan - 07/03/21 1021     Clinical Impression Statement Patient presented in clinic with shoulder fatigue already noted as he has been more diligent with HEP. Patient requires frequent rest breaks due to muscle fatigue. Patient has difficulty with lifting into flexion or overhead. Patient also instructed to limit wall slides to twice daily to see if that helps with reducing popping sound. Patient denies pain with popping of shoulder but relates to being annoying. All MMT taken after therex session which may have limited measurements due to fatigue. Patient feels confident with HEP and is willing to do HEP at home until next MD visit after his visit on 07/05/2021.    Personal Factors and Comorbidities Past/Current Experience;Time since onset of injury/illness/exacerbation    Examination-Activity Limitations Lift;Carry;Reach Overhead    Examination-Participation Restrictions Yard Work;Occupation    Stability/Clinical Decision Making Stable/Uncomplicated    Rehab Potential Good    PT Frequency  2x / week    PT Duration 6 weeks    PT Treatment/Interventions ADLs/Self Care Home  Management;Cryotherapy;Electrical Stimulation;Moist Heat;Therapeutic activities;Therapeutic exercise;Neuromuscular re-education;Manual techniques;Dry needling;Vasopneumatic Device    PT Next Visit Plan D/C at next visit.    PT Home Exercise Plan see past pt instructions.    Consulted and Agree with Plan of Care Patient             Patient will benefit from skilled therapeutic intervention in order to improve the following deficits and impairments:  Decreased mobility, Impaired tone, Increased edema, Decreased scar mobility, Impaired UE functional use, Decreased strength, Decreased activity tolerance  Visit Diagnosis: Stiffness of left shoulder, not elsewhere classified  Left shoulder pain, unspecified chronicity  Biceps tendonosis of left shoulder     Problem List There are no problems to display for this patient.   Marvell Fuller, PTA 07/03/2021, 10:31 AM  [facility] [address] [place], [place], [address] Phone: [phone]   Fax:  [phone]  Name: Wesley Baxter MRN: [id number] Date of Birth: Feb 22, 1971

## 2021-07-03 NOTE — Addendum Note (Signed)
Addended by: Candice Lunney, [place] W on: 07/03/2021 10:44 AM   Modules accepted: Orders

## 2021-07-05 ENCOUNTER — Ambulatory Visit: Payer: 59 | Admitting: Physical Therapy

## 2021-07-05 ENCOUNTER — Other Ambulatory Visit: Payer: Self-pay

## 2021-07-05 ENCOUNTER — Encounter: Payer: Self-pay | Admitting: Physical Therapy

## 2021-07-05 DIAGNOSIS — M25512 Pain in left shoulder: Secondary | ICD-10-CM

## 2021-07-05 DIAGNOSIS — M67814 Other specified disorders of tendon, left shoulder: Secondary | ICD-10-CM

## 2021-07-05 DIAGNOSIS — M25612 Stiffness of left shoulder, not elsewhere classified: Secondary | ICD-10-CM

## 2021-07-05 NOTE — Therapy (Signed)
[facility] [place], [place], [address] Phone: [phone]   Fax:  [phone]  Physical Therapy Treatment  Patient Details  Name: Wesley Baxter MRN: [id number] Date of Birth: March 20, 1971 Referring Provider (PT): Alfredia Client   Encounter Date: 07/05/2021   PT End of Session - 07/05/21 0822     Visit Number 15    Number of Visits 15    Date for PT Re-Evaluation 07/17/21    Authorization Type UHC ;FOTO at least every 5th visit    PT Start Time 0822    PT Stop Time 0859    PT Time Calculation (min) 37 min    Activity Tolerance Patient tolerated treatment well    Behavior During Therapy [facility] for tasks assessed/performed             Past Medical History:  Diagnosis Date   Allergy    Hearing loss     Past Surgical History:  Procedure Laterality Date   ROTATOR CUFF REPAIR Right    in 1998 and again in 2003    There were no vitals filed for this visit.   Subjective Assessment - 07/05/21 0822     Subjective COVID-19 screening performed upon arrival. Reports no problems.    Limitations House hold activities    Patient Stated Goals To be able to use my arm like normal again; to go fishing, kayaking, and being active    Currently in Pain? No/denies                [facility] PT Assessment - 07/05/21 0001       Assessment   Medical Diagnosis L TSA; Bicep Tenodesis; clavicle resection (open); Soft tissue tumor removal    Referring Provider (PT) Alfredia Client    Onset Date/Surgical Date 03/22/21    Hand Dominance Right    Next MD Visit 07/18/2021    Prior Therapy History of 2 RCR to the R shoulder      Precautions   Precautions Shoulder    Type of Shoulder Precautions Limited ER and Abduction      Observation/Other Assessments   Focus on Therapeutic Outcomes (FOTO)  43% limitation 15th visit 07/05/2021      ROM / Strength   AROM / PROM / Strength AROM;Strength      AROM   Overall AROM   Deficits    AROM Assessment Site Shoulder    Right/Left Shoulder Left    Left Shoulder Flexion 130 Degrees    Left Shoulder External Rotation 60 Degrees      Strength   Overall Strength Deficits    Strength Assessment Site Shoulder    Right/Left Shoulder Left    Left Shoulder Flexion 4/5    Left Shoulder ABduction 4/5    Left Shoulder Internal Rotation 4/5    Left Shoulder External Rotation 4-/5                           [facility] Adult PT Treatment/Exercise - 07/05/21 0001       Shoulder Exercises: Seated   Other Seated Exercises Chair push up x10 reps      Shoulder Exercises: Prone   Retraction Strengthening;Left;20 reps;Weights    Retraction Weight (lbs) 3    Extension Strengthening;Left;20 reps;Weights    Extension Weight (lbs) 3      Shoulder Exercises: Sidelying   External Rotation Strengthening;Left;20 reps;Weights    External Rotation Weight (lbs) 1  Flexion Strengthening;Left;20 reps;Weights    Flexion Weight (lbs) 1      Shoulder Exercises: Standing   External Rotation Strengthening;Theraband;20 reps;Left    Theraband Level (Shoulder External Rotation) Level 2 (Red)    Internal Rotation Strengthening;Left;20 reps;Theraband    Theraband Level (Shoulder Internal Rotation) Level 2 (Red)    Flexion Strengthening;Left;20 reps;Weights    Shoulder Flexion Weight (lbs) 1    ABduction Strengthening;Left;20 reps;Weights    Shoulder ABduction Weight (lbs) 1    Extension Strengthening;Left;20 reps;Theraband    Theraband Level (Shoulder Extension) Level 3 (Green)    Row Strengthening;Left;20 reps;Theraband    Theraband Level (Shoulder Row) Level 3 (Green)    Other Standing Exercises B shoulder ext, row blue XTS x20 reps      Shoulder Exercises: ROM/Strengthening   UBE (Upper Arm Bike) 60 RPM x5 min backwards    Wall Pushups 20 reps                       PT Short Term Goals - 06/28/21 6160       PT SHORT TERM GOAL #1   Title indep  with initial HEP    Baseline lacks knowledge of appropriate exercises    Time 1    Period Weeks    Status Achieved    Target Date 05/18/21               PT Long Term Goals - 07/05/21 0825       PT LONG TERM GOAL #1   Title Independent with advanced HEP    Baseline lacks knowledege of appropriate exercises    Time 4    Period Weeks    Status Achieved      PT LONG TERM GOAL #2   Title Active shoulder flexion to 145 degrees so the patient can easily reach overhead.    Baseline AROM <100    Time 6    Period Weeks    Status Not Met      PT LONG TERM GOAL #3   Title Active ER to 70 degrees+ to allow for easily donning/doffing of apparel.    Baseline <20    Time 6    Period Weeks    Status Not Met      PT LONG TERM GOAL #4   Title Increase ROM so patient is able to reach behind back.    Baseline reaches to back of hip at illiac crest    Time 6    Period Weeks    Status Achieved   L1-L2 06/28/2021     PT LONG TERM GOAL #5   Title Increase shoulder strength to a solid 4+/5 to increase stability for performance of functional activities.    Baseline not full ROM to assess    Time 6    Period Weeks    Status Not Met      PT LONG TERM GOAL #6   Title Perform ADL's with pain not > 3/10.    Baseline avoids some ADLs    Time 6    Period Weeks    Status Achieved                   Plan - 07/05/21 0902     Clinical Impression Statement Patient presented in clinic with reports of continued popping of L shoulder. Patient able to tolerate all therex with VCs for rest breaks as needed and scapular retraction. Patient able to self correct with VCs.  Met most goals but limited with strength of L shoulder and ROM. Patient compliant with HEP and encouraged to maintain HEP.    Personal Factors and Comorbidities Past/Current Experience;Time since onset of injury/illness/exacerbation    Examination-Activity Limitations Lift;Carry;Reach Overhead    Examination-Participation  Restrictions Yard Work;Occupation    Stability/Clinical Decision Making Stable/Uncomplicated    Rehab Potential Good    PT Frequency 2x / week    PT Duration 6 weeks    PT Treatment/Interventions ADLs/Self Care Home Management;Cryotherapy;Electrical Stimulation;Moist Heat;Therapeutic activities;Therapeutic exercise;Neuromuscular re-education;Manual techniques;Dry needling;Vasopneumatic Device    PT Next Visit Plan On hold until MD visit.    PT Home Exercise Plan see past pt instructions.    Consulted and Agree with Plan of Care Patient             Patient will benefit from skilled therapeutic intervention in order to improve the following deficits and impairments:  Decreased mobility, Impaired tone, Increased edema, Decreased scar mobility, Impaired UE functional use, Decreased strength, Decreased activity tolerance  Visit Diagnosis: Stiffness of left shoulder, not elsewhere classified  Left shoulder pain, unspecified chronicity  Biceps tendonosis of left shoulder     Problem List There are no problems to display for this patient.   Standley Brooking, PTA 07/05/2021, 10:51 AM  [facility] [address] [place], [place], [address] Phone: [phone]   Fax:  [phone]  Name: Teofilo Lupinacci MRN: [id number] Date of Birth: 1970/10/19

## 2021-12-15 ENCOUNTER — Ambulatory Visit (INDEPENDENT_AMBULATORY_CARE_PROVIDER_SITE_OTHER): Payer: 59 | Admitting: Family Medicine

## 2021-12-15 ENCOUNTER — Encounter: Payer: Self-pay | Admitting: Family Medicine

## 2021-12-15 VITALS — BP 130/79 | HR 77

## 2021-12-15 DIAGNOSIS — M19011 Primary osteoarthritis, right shoulder: Secondary | ICD-10-CM | POA: Diagnosis not present

## 2021-12-15 MED ORDER — METHYLPREDNISOLONE ACETATE 80 MG/ML IJ SUSP
80.0000 mg | Freq: Once | INTRAMUSCULAR | Status: AC
  Administered 2021-12-15: 80 mg via INTRAMUSCULAR

## 2021-12-15 NOTE — Progress Notes (Signed)
? ?  BP 130/79   Pulse 77   SpO2 98%   ? ?Subjective:  ? ?Patient ID: Wesley Baxter, male    DOB: 1971/03/09, 51 y.o.   MRN: [id number] ? ?HPI: ?Wesley Baxter is a 51 y.o. male presenting on 12/15/2021 for Shoulder Pain (right) ? ? ?HPI ?Right shoulder pain ?Patient has been fighting his arthritis in the right shoulder and has seen orthopedic and has not had injections in that.  He says it hurts with multiple different range of motion including forward backwards and up over his head.  He did get about a month or 2 of relief out of the injection but got a lot more relief out of the previous 1.  He denies any fevers or chills or numbness or weakness.  He does have popping and grinding. ? ?Relevant past medical, surgical, family and social history reviewed and updated as indicated. Interim medical history since our last visit reviewed. ?Allergies and medications reviewed and updated. ? ?Review of Systems  ?Constitutional:  Negative for chills and fever.  ?Musculoskeletal:  Positive for arthralgias. Negative for gait problem, joint swelling and myalgias.  ?Skin:  Negative for color change, rash and wound.  ? ?Per HPI unless specifically indicated above ? ? ?Allergies as of 12/15/2021   ?No Known Allergies ?  ? ?  ?Medication List  ?  ? ?  ? Accurate as of December 15, 2021  9:18 AM. If you have any questions, ask your nurse or doctor.  ?  ?  ? ?  ? ?omeprazole 20 MG capsule ?Commonly known as: PRILOSEC ?Take 20 mg by mouth every morning. ?  ? ?  ? ? ? ?Objective:  ? ?BP 130/79   Pulse 77   SpO2 98%   ?Wt Readings from Last 3 Encounters:  ?03/01/21 195 lb 6.4 oz (88.6 kg)  ?04/20/20 193 lb (87.5 kg)  ?04/08/19 184 lb (83.5 kg)  ?  ?Physical Exam ?Vitals and nursing note reviewed.  ?Constitutional:   ?   Appearance: Normal appearance.  ?Musculoskeletal:  ?   Right shoulder: Tenderness and crepitus present. No bony tenderness. Normal range of motion.  ?Neurological:  ?   Mental Status: He is alert.  ? ? ?Right  subacromial injection: Consent form signed. Risk factors of bleeding and infection discussed with patient and patient is agreeable towards injection. Patient prepped with Betadine. Lateral approach towards injection used. Injected 80 mg of Depo-Medrol and 1 mL of 2% lidocaine. Patient tolerated procedure well and no side effects from noted. Minimal to no bleeding. Simple bandage applied after.  ? ?Assessment & Plan:  ? ?Problem List Items Addressed This Visit   ?None ?Visit Diagnoses   ? ? Primary osteoarthritis of right shoulder    -  Primary  ? Relevant Medications  ? methylPREDNISolone acetate (DEPO-MEDROL) injection 80 mg (Start on 12/15/2021  9:30 AM)  ? ?  ?  ? ?Follow up plan: ?Return if symptoms worsen or fail to improve. ? ?Counseling provided for all of the vaccine components ?No orders of the defined types were placed in this encounter. ? ? ?Arville Care, MD ?[facility] ?12/15/2021, 9:18 AM ? ? ?  ?

## 2021-12-20 ENCOUNTER — Ambulatory Visit: Payer: 59 | Admitting: Family Medicine

## 2022-06-12 ENCOUNTER — Ambulatory Visit (INDEPENDENT_AMBULATORY_CARE_PROVIDER_SITE_OTHER): Payer: 59 | Admitting: Family Medicine

## 2022-06-12 ENCOUNTER — Encounter: Payer: Self-pay | Admitting: Family Medicine

## 2022-06-12 VITALS — BP 119/71 | HR 75 | Temp 97.1°F | Resp 20 | Ht 71.5 in | Wt 200.0 lb

## 2022-06-12 DIAGNOSIS — L309 Dermatitis, unspecified: Secondary | ICD-10-CM

## 2022-06-12 MED ORDER — BETAMETHASONE DIPROPIONATE 0.05 % EX CREA: TOPICAL_CREAM | Freq: Two times a day (BID) | CUTANEOUS | 0 refills | Status: DC

## 2022-06-12 MED ORDER — SULFAMETHOXAZOLE-TRIMETHOPRIM 800-160 MG PO TABS: 1.0000 | ORAL_TABLET | Freq: Two times a day (BID) | ORAL | 0 refills | Status: AC

## 2022-06-12 MED ORDER — PREDNISONE 10 MG PO TABS: ORAL_TABLET | ORAL | 0 refills | Status: AC

## 2022-06-12 NOTE — Progress Notes (Signed)
Assessment & Plan:  1. Dermatitis Consulted with physician in the office due to extensiveness of patient's rash. Started Prednisone taper x12 days, Bactrim x7 days, and betamethasone cream. Advised if rash has not resolved with treatment he needs to return to our office.  - Alpha-Gal Panel - sulfamethoxazole-trimethoprim (BACTRIM DS) 800-160 MG tablet; Take 1 tablet by mouth 2 (two) times daily for 7 days.  Dispense: 14 tablet; Refill: 0 - predniSONE (DELTASONE) 10 MG tablet; Take 6 tablets (60 mg total) by mouth daily with breakfast for 2 days, THEN 5 tablets (50 mg total) daily with breakfast for 2 days, THEN 4 tablets (40 mg total) daily with breakfast for 2 days, THEN 3 tablets (30 mg total) daily with breakfast for 2 days, THEN 2 tablets (20 mg total) daily with breakfast for 2 days, THEN 1 tablet (10 mg total) daily with breakfast for 2 days.  Dispense: 42 tablet; Refill: 0 - betamethasone dipropionate 0.05 % cream; Apply topically 2 (two) times daily.  Dispense: 90 g; Refill: 0 - Ambulatory referral to Dermatology   Follow up plan: Return if symptoms worsen or fail to improve.  Deliah Boston, MSN, APRN, FNP-C [facility]  Subjective:   Patient ID: Wesley Baxter, male    DOB: [birth year], 51 y.o.   MRN: [id number]  HPI: Wesley Baxter is a 51 y.o. male presenting on 06/12/2022 for Rash  Rash: Patient complains of rash involving the bilateral arm, neck, and bilateral legs . Rash started 2 weeks ago. Appearance of rash at onset: Color of lesion(s): red, Texture of lesion(s): raised. Discomfort associated with rash: is pruritic.  Associated symptoms: none. Denies: abdominal pain, arthralgia, and fever. Patient has had previous evaluation of rash at urgent care. Patient has had previous treatment with a steroid injection, triamcinolone cream, and Cortisone cream.  Response to treatment: mild improvement, but not resolution. Patient has not had contacts with  similar rash. Patient has not identified precipitant. Patient has not had new exposures (soaps, lotions, laundry detergents, foods, medications, plants, insects or animals.)   ROS: Negative unless specifically indicated above in HPI.   Relevant past medical history reviewed and updated as indicated.   Allergies and medications reviewed and updated.   Current Outpatient Medications:    omeprazole (PRILOSEC) 20 MG capsule, Take 20 mg by mouth every morning., Disp: , Rfl:   No Known Allergies  Objective:   BP 119/71   Pulse 75   Temp (!) 97.1 F (36.2 C)   Resp 20   Ht 5' 11.5" (1.816 m)   Wt 200 lb (90.7 kg)   SpO2 97%   BMI 27.51 kg/m    Physical Exam Vitals reviewed.  Constitutional:      General: He is not in acute distress.    Appearance: Normal appearance. He is not ill-appearing, toxic-appearing or diaphoretic.  HENT:     Head: Normocephalic and atraumatic.  Eyes:     General: No scleral icterus.       Right eye: No discharge.        Left eye: No discharge.     Conjunctiva/sclera: Conjunctivae normal.  Cardiovascular:     Rate and Rhythm: Normal rate.  Pulmonary:     Effort: Pulmonary effort is normal. No respiratory distress.  Musculoskeletal:        General: Normal range of motion.     Cervical back: Normal range of motion.  Skin:    General: Skin is warm and dry.  Findings: Rash (severe to back of neck, back of upper arms, and backs of legs) present. Rash is macular and papular.  Neurological:     Mental Status: He is alert and oriented to person, place, and time. Mental status is at baseline.  Psychiatric:        Mood and Affect: Mood normal.        Behavior: Behavior normal.        Thought Content: Thought content normal.        Judgment: Judgment normal.

## 2022-06-14 ENCOUNTER — Telehealth: Payer: Self-pay | Admitting: Family Medicine

## 2022-06-14 LAB — ALPHA-GAL PANEL
Allergen Lamb IgE: <0.1 kU/L
Beef IgE: 0.17 kU/L — AB
IgE (Immunoglobulin E), Serum: 12 IU/mL (ref 6–495)
O215-IgE Alpha-Gal: 1.02 kU/L — AB
Pork IgE: <0.1 kU/L

## 2022-06-14 NOTE — Telephone Encounter (Signed)
Pt questioned if he could have milk, pork?  Per Dr. Louanne Skye informed pt that he is able to eat pork for now but that it could change in the future. Advised to avoid all beef products including dairy ( milk, cheeses, ice cream). Pt understood and will call back with any concerns.

## 2022-06-15 ENCOUNTER — Encounter: Payer: Self-pay | Admitting: Family Medicine

## 2022-07-24 NOTE — Progress Notes (Unsigned)
NEW PATIENT Date of Service/Encounter:  07/26/22 Referring provider: Dettinger, Fransisca Kaufmann, MD Primary care provider: Dettinger, Fransisca Kaufmann, MD  Subjective:  Wesley Baxter is a 51 y.o. male presenting today for evaluation of rash. History obtained from: chart review and patient.   Consult only-[employer] insurance  Rash started on Labor day weekend.  Initially had seen multiple providers and was told that it was contact dermatitis suspected due to poison ivy.  It was all over the back of his legs and arms and spread to back. He has had poison ivy/oak in the past, and does not feel that this was similar to his previous episodes. He does have pictures of the rash which show a very erythematous raised edematous patch spanning the majority of his calf. It was extremely itchy.  Denies any other systemic symptoms. He did have some labs tested by his PCP and his alpha gal was positive.  His beef IgE was slightly elevated but below negative threshold.  Lamb and pork were negative.  Therefore he cut out beef and dairy from his diet, but this did not resolve rash. He has continued to eat sausages regularly and feels that his stools are perhaps more loose when he eats sausage, but otherwise denies any other symptoms. He did have shrimp one night and the rash seemed to worsen, but it did not resolve within the next 24 hours, and he has continued to have rash since without any correlation with shellfish ingestion. He was taking benadryl 25 mg daily. He doesn't feel like that helped a lot.  He was never put on a second-generation antihistamine, and he has never been on more than once daily dosing. He does still have the rash but it is improving.  When he had the rash, it did expand but would stay in the same areas for weeks at a time. He was worrie worried about his leather couch being a trigger for him since he felt that the areas which were most flared were in areas where his skin was in direct contact  with the leather couch.  However, these are also dependent areas of pressure when sitting.  He also notes rash around his ankles from his boots being tight. Rash did not leave bruising on resolution, but did leave the hypopigmented areas.  Treatments tried: halobetasol, bethametasone topical ointments were provided. He was put on fragrance and dye free personal care products. He was evaluated by dermatologist with a biopsy reportedly showing a mix of atopic and contact dermatitis.  He reports being advised by dermatology to follow-up with allergy regarding etiology of rash.  He does have eczema but this has generally been controlled, and he feels very different from the most recent rash.  There were areas where the 2 rashes seem to blend together.  PCP visit on 06/12/22 for extensive dermatitis treated with prednisone, bactrim and betamethasone. Alpha-gal panel ordered. Referred to dermatology. Alpha-gal positive at 1.02, negative to beef (0.17), pork and lamb.   Past Medical History: Past Medical History:  Diagnosis Date   Allergy    Hearing loss    Medication List:  Current Outpatient Medications  Medication Sig Dispense Refill   betamethasone dipropionate 0.05 % cream Apply topically 2 (two) times daily. 90 g 0   halobetasol (ULTRAVATE) 0.05 % cream Apply topically 2 (two) times daily as needed.     omeprazole (PRILOSEC) 20 MG capsule Take 20 mg by mouth every morning.     triamcinolone cream (KENALOG) 0.5 %  Apply topically 4 (four) times daily.     lidocaine (XYLOCAINE) 1 % (with preservative) injection by Infiltration route. (Patient not taking: Reported on 07/26/2022)     No current facility-administered medications for this visit.   Known Allergies:  No Known Allergies Past Surgical History: Past Surgical History:  Procedure Laterality Date   ROTATOR CUFF REPAIR Right    in 1998 and again in 2003   Family History: Family History  Problem Relation Age of Onset    Hyperlipidemia Mother    Hearing loss Father    Cancer Maternal Grandmother    Cancer Maternal Grandfather    Early death Maternal Grandfather 65   Cancer Paternal Grandfather    Social History: Aeneas lives in a house buit 51 yo, no water damage, hardwood floors, gas and electric heating, outdoor dogs, no cockroach, no DM protection on bedding or pillows, no smoke exposure, works as a TEFL teacher, no HEPA filter, [place].   ROS:  All other systems negative except as noted per HPI.  Objective:  Blood pressure 114/80, pulse 69, temperature 98.4 F (36.9 C), temperature source Temporal, resp. rate 17, height 6' (1.829 m), weight 196 lb (88.9 kg), SpO2 98 %. Body mass index is 26.58 kg/m. Physical Exam:  General Appearance:  Alert, cooperative, no distress, appears stated age  Head:  Normocephalic, without obvious abnormality, atraumatic  Eyes:  Conjunctiva clear, EOM's intact  Nose: Nares normal, no visible rhinorrhea  Throat: Lips, tongue normal; teeth and gums normal,  moist mucous membranes  Neck: Supple, symmetrical  Lungs:   Respirations unlabored, no coughing  Heart:  Appears well perfused  Extremities: No edema  Skin: Few scattered erythematous patches on upper and lower extremities, hypopigmented patches on lower extremities  Neurologic: No gross deficits     Diagnostics: None today-will return for patch testing  Assessment and Plan  Rash: contact dermatitis vs urticaria-most likely contact based on photos, one are looks urticarial, but history not consistent - continue to use topical ointments as prescribed by Dermatology - allergen avoidance is main measure of treatment for contact dermatitis, we will undergo patch testing to determine possible inciting allergen - return for patch testing at your convenience; will need to hold on topical steroids for 2 weeks prior to this appointment - can start zyrtec 10 mg twice daily, max  dose 20 mg (2 tablets) twice daily for itching - take pictures of rash if returns - unlikely alpha gal based on history, however your testing was positive to alpha gal--since you have not really cut out mammalian meat from your diet and are not experiencing symptoms, suspect that it is okay to continue to eat mammalian meat.  Should you have any symptoms concerning for an allergic reaction after eating pork, beef, lamb, etc. please discontinue eating all mammalian meat and let us know  Follow-up in 2 weeks for patch testing.  This note in its entirety was forwarded to the Provider who requested this consultation.  Thank you for your kind referral. I appreciate the opportunity to take part in Garlen's care. Please do not hesitate to contact me with questions.  Sincerely,  Sigurd Sos, MD [facility]

## 2022-07-26 ENCOUNTER — Encounter: Payer: Self-pay | Admitting: Internal Medicine

## 2022-07-26 ENCOUNTER — Ambulatory Visit (INDEPENDENT_AMBULATORY_CARE_PROVIDER_SITE_OTHER): Payer: 59 | Admitting: Internal Medicine

## 2022-07-26 VITALS — BP 114/80 | HR 69 | Temp 98.4°F | Resp 17 | Ht 72.0 in | Wt 196.0 lb

## 2022-07-26 DIAGNOSIS — L259 Unspecified contact dermatitis, unspecified cause: Secondary | ICD-10-CM | POA: Diagnosis not present

## 2022-07-26 DIAGNOSIS — R21 Rash and other nonspecific skin eruption: Secondary | ICD-10-CM | POA: Diagnosis not present

## 2022-07-26 NOTE — Patient Instructions (Addendum)
Rash: contact dermatitis vs urticaria - continue to use topical ointments as prescribed by Dermatology - allergen avoidance is main measure of treatment for contact dermatitis, we will undergo patch testing to determine possible inciting allergen - return for patch testing at your convenience; will need to hold on topical steroids for 2 weeks prior to this appointment - can start zyrtec 10 mg twice daily, max dose 20 mg (2 tablets) twice daily for itching - take pictures of rash if returns - unlikely alpha gal based on history, however your testing was positive to alpha gal--since you have not really cut out mammalian meat from your diet and are not experiencing symptoms, suspect that it is okay to continue to eat mammalian meat.  Should you have any symptoms concerning for an allergic reaction after eating pork, beef, lamb, etc. please discontinue eating all mammalian meat and let us know  Follow-up in 2 weeks for patch testing.  It was wonderful meeting you today! Thank you for letting me participate in your care. Sigurd Sos, MD [facility]

## 2022-08-13 ENCOUNTER — Ambulatory Visit (INDEPENDENT_AMBULATORY_CARE_PROVIDER_SITE_OTHER): Payer: 59 | Admitting: Internal Medicine

## 2022-08-13 DIAGNOSIS — L2489 Irritant contact dermatitis due to other agents: Secondary | ICD-10-CM

## 2022-08-13 NOTE — Progress Notes (Signed)
    Follow-up Note  RE: Wesley Baxter MRN: [id number] DOB: [birth year] Date of Office Visit: 08/13/2022  Primary care provider: Dettinger, Elige Radon, MD Referring provider: Dettinger, Elige Radon, MD   [name] returns to the office today for the patch test placement, given suspected history of contact dermatitis.    Diagnostics: True Test patches placed.    Plan:   Allergic contact dermatitis - Instructions provided on care of the patches for the next 48 hours. - [name] was instructed to avoid showering for the next 48 hours. - [name] will follow up in 48 hours and 96 hours for patch readings.    Ferol Luz, MD [facility]

## 2022-08-15 ENCOUNTER — Encounter: Payer: Self-pay | Admitting: Internal Medicine

## 2022-08-15 ENCOUNTER — Ambulatory Visit: Payer: 59 | Admitting: Internal Medicine

## 2022-08-15 DIAGNOSIS — L2389 Allergic contact dermatitis due to other agents: Secondary | ICD-10-CM

## 2022-08-15 NOTE — Progress Notes (Signed)
   Follow Up Note  RE: Wesley Baxter MRN: [id number] DOB: 02-Aug-1971 Date of Office Visit: 08/15/2022  Referring provider: Dettinger, Elige Radon, MD Primary care provider: Dettinger, Elige Radon, MD  History of Present Illness: I had the pleasure of seeing Wesley Baxter for a follow up visit at the [facility] on 08/15/2022. He is a 51 y.o. male, who is being followed for dermatitis. Today he is here for initial patch test interpretation, given suspected history of contact dermatitis.   Diagnostics:  TRUE TEST 48 hour reading:     Assessment and Plan: Wesley Baxter is a 51 y.o. male with: Concern for Contact Dermatitis:  Equivocal positive to neomycin and formaldehyde   The patient has been provided detailed information regarding the substances he is sensitive to, as well as products containing the substances.  Meticulous avoidance of these substances is recommended. If avoidance is not possible, the use of barrier creams or lotions is recommended. If symptoms persist or progress despite meticulous avoidance of chemicals/substances above, dermatology evaluation may be warranted. No follow-ups on file.  It was my pleasure to see Wesley Baxter today and participate in his care. Please feel free to contact me with any questions or concerns.  Sincerely,   Ferol Luz, MD [facility]

## 2022-08-16 NOTE — Progress Notes (Signed)
   Follow Up Note  RE: Wesley Baxter MRN: [id number] DOB: 1971/05/20 Date of Office Visit: 08/17/2022  Referring provider: Dettinger, Elige Radon, MD Primary care provider: Dettinger, Elige Radon, MD  History of Present Illness: I had the pleasure of seeing Wesley Baxter for a follow up visit at the [facility] on 08/17/2022. He is a 51 y.o. male, who is being followed for rash. Today he is here for final patch test interpretation, given suspected history of contact dermatitis.   Diagnostics:  TRUE TEST 96 hour reading:   T.R.U.E. Test - 08/17/22 1600       Test Information   Time Antigen Placed 0400    Manufacturer Other    Lot # [id number]    Location Back    Number of Test 36    Reading Interval Day 1    Panel Panel 1;Panel 2;Panel 3      Panel 1   1. Nickel Sulfate 0    2. Wool Alcohols 0    3. Neomycin Sulfate 2    4. Potassium Dichromate 0    5. Caine Mix 0    6. Fragrance Mix 0    7. Colophony 0    8. Paraben Mix 0    9. Negative Control 0    10. Balsam of [place] 0    11. Ethylenediamine Dihydrochloride 0    12. Cobalt Dichloride 0      Panel 2   13. p-tert Butylphenol Formaldehyde Resin 0    14. Epoxy Resin 0    15. Carba Mix 0    16.  Black Rubber Mix 0    17. Cl+ Me-Isothiazolinone 0    18. Quaternium-15 0    19. Methyldibromo Glutaronitrile 0    20. p-Phenylenediamine 0    21. Formaldehyde 1    22. Mercapto Mix 0    23. Thimerosal 0    24. Thiuram Mix 0      Panel 3   25. Diazolidinyl Urea 0    26. Quinoline Mix 0    27. Tixocortol-21-Pivalate 0    28. Gold Sodium Thiosulfate 0    29. Imidazolidinyl Urea 1    30. Budesonide 0    31. Hydrocortisone-17-Butyrate 0    32. Mercaptobenzothiazole 0    33. Bacitracin 0    34. Parthenolide 0    35. Disperse Blue 106 0    36. 2-Bromo-2-Nitropropane-1,3-diol 0              Assessment and Plan: Wesley Baxter is a 51 y.o. male with: Concern for Contact Dermatitis:  The patient has been  provided detailed information regarding the substances he is sensitive to, as well as products containing the substances.  Meticulous avoidance of these substances is recommended. If avoidance is not possible, the use of barrier creams or lotions is recommended. If symptoms persist or progress despite meticulous avoidance of chemicals/substances above, dermatology evaluation may be warranted.  Follow-up in 6 to 8 weeks, sooner if needed Pamphlet provided for Dupixent to consider at follow-up no improvement following avoidance of above contact allergens  It was my pleasure to see Wesley Baxter today and participate in his care. Please feel free to contact me with any questions or concerns.  Sincerely,   Tonny Bollman, MD [facility]

## 2022-08-17 ENCOUNTER — Ambulatory Visit: Payer: 59 | Admitting: Internal Medicine

## 2022-08-17 ENCOUNTER — Encounter: Payer: Self-pay | Admitting: Internal Medicine

## 2022-08-17 DIAGNOSIS — L2389 Allergic contact dermatitis due to other agents: Secondary | ICD-10-CM | POA: Insufficient documentation

## 2022-09-03 ENCOUNTER — Ambulatory Visit (INDEPENDENT_AMBULATORY_CARE_PROVIDER_SITE_OTHER): Payer: 59 | Admitting: Family Medicine

## 2022-09-03 ENCOUNTER — Encounter: Payer: Self-pay | Admitting: Family Medicine

## 2022-09-03 VITALS — BP 109/69 | HR 87 | Temp 98.9°F | Ht 72.0 in | Wt 191.0 lb

## 2022-09-03 DIAGNOSIS — L03211 Cellulitis of face: Secondary | ICD-10-CM | POA: Diagnosis not present

## 2022-09-03 MED ORDER — CEPHALEXIN 500 MG PO CAPS: 500.0000 mg | ORAL_CAPSULE | Freq: Four times a day (QID) | ORAL | 0 refills | Status: DC

## 2022-09-03 NOTE — Progress Notes (Signed)
Chief Complaint  Patient presents with   Sinusitis    HPI  Patient presents today for Patient presents with upper respiratory congestion. Rhinorrhea that is frequently purulent. There is moderate sore throat. Patient reports not coughing l. Rhino with  blood. Fce got a pimple on the nose that spread causing redness and swelling at the right medial epicanthus. Swollen lymph node left sub mand last week. Using zyrtec & decongestant. Went to urgent care & got predisone and cephalexin. Now on day 4 of antibiotic.. No sputum noted. There is high fever, chills and sweats at night except much less last night. . The patient denies being short of breath. Onset was 2 weeks ago. Gradually worsening. Tried OTCs without improvement.  PMH: Smoking status noted ROS: Per HPI  Objective: BP 109/69   Pulse 87   Temp 98.9 F (37.2 C)   Ht 6' (1.829 m)   Wt 191 lb (86.6 kg)   SpO2 96%   BMI 25.90 kg/m  Gen: NAD, alert, cooperative with exam HEENT: NCAT, Nasal passages swollen, red CV: RRR, good S1/S2, no murmur Resp: Bronchitis changes with scattered wheezes, non-labored Ext: No edema, warm Neuro: Alert and oriented, No gross deficit Skin: mild erythema and scaling at forehead, hairline.   Assessment and plan:  1. Facial cellulitis     Meds ordered this encounter  Medications   cephALEXin (KEFLEX) 500 MG capsule    Sig: Take 1 capsule (500 mg total) by mouth 4 (four) times daily.    Dispense:  12 capsule    Refill:  0    No orders of the defined types were placed in this encounter.   Follow up as needed.  Mechele Claude, MD

## 2022-09-04 LAB — CBC WITH DIFFERENTIAL/PLATELET
Basophils Absolute: 0 10*3/uL (ref 0.0–0.2)
Basos: 0 %
EOS (ABSOLUTE): 0 10*3/uL (ref 0.0–0.4)
Eos: 0 %
Hematocrit: 48.1 % (ref 37.5–51.0)
Hemoglobin: 15.5 g/dL (ref 13.0–17.7)
Immature Grans (Abs): 0.1 10*3/uL (ref 0.0–0.1)
Immature Granulocytes: 1 %
Lymphocytes Absolute: 1.4 10*3/uL (ref 0.7–3.1)
Lymphs: 12 %
MCH: 29.6 pg (ref 26.6–33.0)
MCHC: 32.2 g/dL (ref 31.5–35.7)
MCV: 92 fL (ref 79–97)
Monocytes Absolute: 0.8 10*3/uL (ref 0.1–0.9)
Monocytes: 7 %
Neutrophils Absolute: 9.1 10*3/uL — ABNORMAL HIGH (ref 1.4–7.0)
Neutrophils: 80 %
Platelets: 401 10*3/uL (ref 150–450)
RBC: 5.24 x10E6/uL (ref 4.14–5.80)
RDW: 13.1 % (ref 11.6–15.4)
WBC: 11.4 10*3/uL — ABNORMAL HIGH (ref 3.4–10.8)

## 2022-09-04 LAB — CMP14+EGFR
ALT: 18 IU/L (ref 0–44)
AST: 16 IU/L (ref 0–40)
Albumin/Globulin Ratio: 1.1 — ABNORMAL LOW (ref 1.2–2.2)
Albumin: 3.8 g/dL (ref 3.8–4.9)
Alkaline Phosphatase: 96 IU/L (ref 44–121)
BUN/Creatinine Ratio: 20 (ref 9–20)
BUN: 17 mg/dL (ref 6–24)
Bilirubin Total: 0.3 mg/dL (ref 0.0–1.2)
CO2: 26 mmol/L (ref 20–29)
Calcium: 9.2 mg/dL (ref 8.7–10.2)
Chloride: 98 mmol/L (ref 96–106)
Creatinine, Ser: 0.86 mg/dL (ref 0.76–1.27)
Globulin, Total: 3.4 g/dL (ref 1.5–4.5)
Glucose: 110 mg/dL — ABNORMAL HIGH (ref 70–99)
Potassium: 4.5 mmol/L (ref 3.5–5.2)
Sodium: 139 mmol/L (ref 134–144)
Total Protein: 7.2 g/dL (ref 6.0–8.5)
eGFR: 104 mL/min/{1.73_m2} (ref >59)

## 2022-09-04 NOTE — Progress Notes (Signed)
Hello Davaris,  Your lab result is normal and/or stable.Some minor variations that are not significant are commonly marked abnormal, but do not represent any medical problem for you.  Best regards, Mechele Claude, M.D.

## 2022-10-16 NOTE — Progress Notes (Signed)
FOLLOW UP Date of Service/Encounter:  10/18/22   Subjective:  Wesley Baxter (DOB: 04-03-1971) is a 52 y.o. male who returns to the [facility] on 10/18/2022 in re-evaluation of the following: Rash, positive alpha gal testing History obtained from: chart review and patient.  For Review, LV was on 08/17/22  with Dr.Padraig Nhan seen for  final patch test ready positive to neomycin,  formaldehyde, imidazolidinyl urea .  Pertinent history/diagnostics:  Rash:  September 2023 rash started. Initially told poison ivy contact dermatitis. Started on his calf and lasted weeks. Resolved and left hypopigemnted patch. Benadryl failed.  PCP ordered labs: alpha-gal + 1.02, beef 0.17 IgE elevated, but below + threshold. Lamb and pork negative. He cut out beef, but rash did not resolve.  - TRUE patch testing + neomycin,  formaldehyde, imidazolidinyl urea  Today presents for follow-up. Diagnosed with facial cellulitis treated with keflex in December 2023. He did end up on prednisone. This cleared up his rash very well. His rash is very manageable now.   He can't tell that zyrtec helps with itching.  He is only using once daily if needed. He did eat beef vegetable soup and states that his rash to get worse after eating this, but denies other systemic symptoms.  He is eating sausage at least once a week without symptoms.  He tries to avoid beef.   Allergies as of 10/18/2022       Reactions   Alpha-gal         Medication List        Accurate as of October 18, 2022  1:13 PM. If you have any questions, ask your nurse or doctor.          STOP taking these medications    cephALEXin 500 MG capsule Commonly known as: KEFLEX   lidocaine 1 % (with preservative) injection Commonly known as: XYLOCAINE   predniSONE 10 MG tablet Commonly known as: DELTASONE   triamcinolone cream 0.5 % Commonly known as: KENALOG Replaced by: triamcinolone ointment 0.1 %       TAKE these  medications    betamethasone dipropionate 0.05 % cream Apply topically 2 (two) times daily.   cetirizine 10 MG tablet Commonly known as: ZYRTEC Take 10 mg by mouth daily.   EPINEPHrine 0.3 mg/0.3 mL Soaj injection Commonly known as: EpiPen 2-Pak Inject 0.3 mg into the muscle as needed for anaphylaxis.   halobetasol 0.05 % cream Commonly known as: ULTRAVATE Apply topically 2 (two) times daily as needed.   omeprazole 20 MG capsule Commonly known as: PRILOSEC Take 20 mg by mouth every morning.   triamcinolone ointment 0.1 % Commonly known as: KENALOG Apply topically twice daily to BODY as needed for red, sandpaper like rash.  Do not use on face, groin or armpits. Replaces: triamcinolone cream 0.5 %       Past Medical History:  Diagnosis Date   Allergy    Hearing loss    Past Surgical History:  Procedure Laterality Date   ROTATOR CUFF REPAIR Right    in 1998 and again in 2003   Otherwise, there have been no changes to his past medical history, surgical history, family history, or social history.  ROS: All others negative except as noted per HPI.   Objective:  BP 118/64   Pulse 73   Temp 99 F (37.2 C) (Temporal)   Resp 12   SpO2 96%  There is no height or weight on file to calculate BMI. Physical Exam:  General Appearance:  Alert, cooperative, no distress, appears stated age  Head:  Normocephalic, without obvious abnormality, atraumatic  Eyes:  Conjunctiva clear, EOM's intact  Nose: Nares normal, hypertrophic turbinates and normal mucosa  Throat: Lips, tongue normal; teeth and gums normal, normal posterior oropharynx  Neck: Supple, symmetrical  Lungs:   clear to auscultation bilaterally, Respirations unlabored, no coughing  Heart:  regular rate and rhythm and no murmur, Appears well perfused  Extremities: No edema  Skin: Skin color, texture, turgor normal, few circular erythematous patches on bilateral lower extremities  Neurologic: No gross deficits     Assessment/Plan   Rash: today's rash more consistent with eczema - continue to use topical ointments as prescribed by Dermatology - allergen avoidance is main measure of treatment for contact dermatitis, avoid the following:  - patch testing positive to: neomycin,  formaldehyde, imidazolidinyl urea - can start zyrtec 10 mg twice daily, max dose 20 mg (2 tablets) twice daily for itching   Alpha-gal testing positive-suspect false positive, has never had allergic reaction. Continues to eat pork and beef - unlikely alpha gal based on history, however your testing was positive to alpha gal--since you have not really cut out mammalian meat from your diet and are not experiencing symptoms, suspect that it is okay to continue to eat mammalian meat.  Should you have any symptoms concerning for an allergic reaction after eating pork, beef, lamb, etc. please discontinue eating all mammalian meat and let us know - epipen prescribed; doubt you will need it-reviewed how and when to use epipen - plan to retest in 12 months  Follow-up in 6 months, sooner if needed.  Sigurd Sos, MD  [facility]

## 2022-10-18 ENCOUNTER — Encounter: Payer: Self-pay | Admitting: Internal Medicine

## 2022-10-18 ENCOUNTER — Ambulatory Visit (INDEPENDENT_AMBULATORY_CARE_PROVIDER_SITE_OTHER): Payer: 59 | Admitting: Internal Medicine

## 2022-10-18 VITALS — BP 118/64 | HR 73 | Temp 99.0°F | Resp 12

## 2022-10-18 DIAGNOSIS — L2084 Intrinsic (allergic) eczema: Secondary | ICD-10-CM | POA: Diagnosis not present

## 2022-10-18 DIAGNOSIS — Z91018 Allergy to other foods: Secondary | ICD-10-CM

## 2022-10-18 MED ORDER — EPINEPHRINE 0.3 MG/0.3ML IJ SOAJ: 0.3000 mg | INTRAMUSCULAR | 2 refills | Status: DC | PRN

## 2022-10-18 MED ORDER — TRIAMCINOLONE ACETONIDE 0.1 % EX OINT: TOPICAL_OINTMENT | CUTANEOUS | 1 refills | Status: DC

## 2022-10-18 NOTE — Patient Instructions (Addendum)
Rash:  - continue to use topical ointments as prescribed -   - triamcinolone 0.1% twice daily as needed - allergen avoidance is main measure of treatment for contact dermatitis, avoid the following:  - patch testing positive to: neomycin,  formaldehyde, imidazolidinyl urea - can start zyrtec 10 mg twice daily, max dose 20 mg (2 tablets) twice daily for itching   Other interchangeable options: Zyrtec (cetirizine) 10 mg, Claritin (loratadine) 10 mg, Xyzal (levocetirizine) 5 mg or Allegra (fexofenadine) 180 mg daily as needed   Alpha-gal testing positive-suspect false positive, has never had allergic reaction. Continues to eat pork and beef, does have worsening rash (eczematous flare) with beef - unlikely alpha gal based on history, however your testing was positive to alpha gal--since you have not really cut out mammalian meat from your diet and are not experiencing symptoms, suspect that it is okay to continue to eat mammalian meat.  Should you have any symptoms concerning for an allergic reaction after eating pork, beef, lamb, etc. please discontinue eating all mammalian meat and let us know - epipen prescribed; doubt you will need it-reviewed how and when to use epipen - plan to retest in 12 months  Follow-up in 6 months, sooner if needed.  It was wonderful meeting you today! Thank you for letting me participate in your care. Sigurd Sos, MD [facility]

## 2022-10-24 ENCOUNTER — Other Ambulatory Visit: Payer: Self-pay

## 2022-10-24 MED ORDER — AUVI-Q 0.3 MG/0.3ML IJ SOAJ: 0.3000 mg | INTRAMUSCULAR | 1 refills | Status: DC | PRN

## 2022-10-24 NOTE — Telephone Encounter (Signed)
Awesome thanks!

## 2022-10-24 NOTE — Telephone Encounter (Signed)
[facility] called and pts inusrance does not cover epipen teva or mylan or generics but will cover auvi-q so sent in  [facility] to [facility] and left detailed message for pt about the change and if he had any questions to let us know.

## 2022-11-01 ENCOUNTER — Encounter: Payer: Self-pay | Admitting: Family Medicine

## 2022-11-01 ENCOUNTER — Ambulatory Visit (INDEPENDENT_AMBULATORY_CARE_PROVIDER_SITE_OTHER): Payer: 59 | Admitting: Family Medicine

## 2022-11-01 VITALS — BP 129/89 | HR 67 | Ht 72.0 in | Wt 201.0 lb

## 2022-11-01 DIAGNOSIS — Z125 Encounter for screening for malignant neoplasm of prostate: Secondary | ICD-10-CM

## 2022-11-01 DIAGNOSIS — Z0000 Encounter for general adult medical examination without abnormal findings: Secondary | ICD-10-CM

## 2022-11-01 DIAGNOSIS — Z0001 Encounter for general adult medical examination with abnormal findings: Secondary | ICD-10-CM | POA: Diagnosis not present

## 2022-11-01 DIAGNOSIS — Z23 Encounter for immunization: Secondary | ICD-10-CM | POA: Diagnosis not present

## 2022-11-01 DIAGNOSIS — R42 Dizziness and giddiness: Secondary | ICD-10-CM

## 2022-11-01 DIAGNOSIS — Z1211 Encounter for screening for malignant neoplasm of colon: Secondary | ICD-10-CM | POA: Diagnosis not present

## 2022-11-01 NOTE — Progress Notes (Addendum)
BP 129/89   Pulse 67   Ht 6' (1.829 m)   Wt 201 lb (91.2 kg)   SpO2 98%   BMI 27.26 kg/m    Subjective:   Patient ID: Wesley Baxter, male    DOB: Jan 31, 1971, 52 y.o.   MRN: [id number]  HPI: Wesley Baxter is a 52 y.o. male presenting on 11/01/2022 for Medical Management of Chronic Issues (CPE)   HPI Physical exam Patient denies any chest pain, shortness of breath, headaches or vision issues, abdominal complaints, diarrhea, nausea, vomiting, or joint issues.  He states his only real complaint is that occasionally and not too frequently has been going on occasionally since last year that he will get orthostatic dizziness.  He says he will go from a sitting or squatting position to a standing and he will feel lightheaded or dizzy.  He says 1 time postoperatively last year he did actually pass out when he did this but every other time he just feels lightheaded or dizzy.  He denies any chest pain or palpitations or shortness of breath.  He says it passes very quickly in a matter of seconds.  Relevant past medical, surgical, family and social history reviewed and updated as indicated. Interim medical history since our last visit reviewed. Allergies and medications reviewed and updated.  Review of Systems  Constitutional:  Negative for chills and fever.  HENT:  Negative for ear pain and tinnitus.   Eyes:  Negative for pain.  Respiratory:  Negative for cough, shortness of breath and wheezing.   Cardiovascular:  Negative for chest pain, palpitations and leg swelling.  Gastrointestinal:  Negative for abdominal pain, blood in stool, constipation and diarrhea.  Genitourinary:  Negative for dysuria and hematuria.  Musculoskeletal:  Negative for back pain, gait problem and myalgias.  Skin:  Negative for rash.  Neurological:  Positive for dizziness and light-headedness. Negative for weakness and headaches.  Psychiatric/Behavioral:  Negative for suicidal ideas.   All other systems  reviewed and are negative.   Per HPI unless specifically indicated above   Allergies as of 11/01/2022       Reactions   Alpha-gal         Medication List        Accurate as of November 01, 2022 11:54 AM. If you have any questions, ask your nurse or doctor.          STOP taking these medications    Auvi-Q 0.3 mg/0.3 mL Soaj injection Generic drug: EPINEPHrine Stopped by: Fransisca Kaufmann Kennya Schwenn, MD   betamethasone dipropionate 0.05 % cream Stopped by: Fransisca Kaufmann Keyanah Kozicki, MD   EPINEPHrine 0.3 mg/0.3 mL Soaj injection Commonly known as: EpiPen 2-Pak Stopped by: Fransisca Kaufmann Nanako Stopher, MD   halobetasol 0.05 % cream Commonly known as: ULTRAVATE Stopped by: Worthy Rancher, MD       TAKE these medications    cetirizine 10 MG tablet Commonly known as: ZYRTEC Take 10 mg by mouth as needed for allergies.   omeprazole 20 MG capsule Commonly known as: PRILOSEC Take 20 mg by mouth every morning.   triamcinolone ointment 0.1 % Commonly known as: KENALOG Apply topically twice daily to BODY as needed for red, sandpaper like rash.  Do not use on face, groin or armpits.         Objective:   BP 129/89   Pulse 67   Ht 6' (1.829 m)   Wt 201 lb (91.2 kg)   SpO2 98%   BMI 27.26 kg/m  Wt Readings from Last 3 Encounters:  11/01/22 201 lb (91.2 kg)  09/03/22 191 lb (86.6 kg)  07/26/22 196 lb (88.9 kg)    Physical Exam Vitals reviewed.  Constitutional:      General: He is not in acute distress.    Appearance: He is well-developed. He is not diaphoretic.  HENT:     Right Ear: Tympanic membrane, ear canal and external ear normal.     Left Ear: Tympanic membrane, ear canal and external ear normal.     Nose: Nose normal.     Mouth/Throat:     Pharynx: No oropharyngeal exudate.  Eyes:     General: No scleral icterus.    Conjunctiva/sclera: Conjunctivae normal.  Neck:     Thyroid: No thyromegaly.  Cardiovascular:     Rate and Rhythm: Normal rate and regular  rhythm.     Heart sounds: Normal heart sounds. No murmur heard. Pulmonary:     Effort: Pulmonary effort is normal. No respiratory distress.     Breath sounds: Normal breath sounds. No wheezing, rhonchi or rales.  Abdominal:     General: Bowel sounds are normal. There is no distension.     Palpations: Abdomen is soft.     Tenderness: There is no abdominal tenderness. There is no guarding or rebound.  Musculoskeletal:        General: No swelling. Normal range of motion.     Cervical back: Neck supple.  Lymphadenopathy:     Cervical: No cervical adenopathy.  Skin:    General: Skin is warm and dry.     Findings: No rash.  Neurological:     Mental Status: He is alert and oriented to person, place, and time.     Coordination: Coordination normal.  Psychiatric:        Behavior: Behavior normal.       Assessment & Plan:   Problem List Items Addressed This Visit   None Visit Diagnoses     Physical exam    -  Primary   Relevant Orders   CMP14+EGFR   CBC with Differential/Platelet   Lipid panel   TSH   Need for shingles vaccine       Relevant Orders   Zoster Recombinant (Shingrix ) (Completed)   Orthostatic dizziness       Relevant Orders   CMP14+EGFR   CBC with Differential/Platelet   Lipid panel   TSH   Colon cancer screening       Relevant Orders   Ambulatory referral to Gastroenterology   Prostate cancer screening       Relevant Orders   PSA, total and free       Will do blood work testing.  Will also check thyroid due to his orthostatic dizziness reported.  If continues to have issues with that then we may refer to cardiology for further workup. No other changes  Orthostatic blood pressure testing was normal here in the office today, will have a monitor for now and if he starts experiencing it more often or frequently then he will let us know and we may do cardiology referral in the future. Follow up plan: Return in about 1 year (around 11/02/2023), or if  symptoms worsen or fail to improve, for Physical exam.  Counseling provided for all of the vaccine components Orders Placed This Encounter  Procedures   Zoster Recombinant (Shingrix )   CMP14+EGFR   CBC with Differential/Platelet   Lipid panel   TSH   PSA, total and  free   Ambulatory referral to Gastroenterology    Caryl Pina, MD [facility] 11/01/2022, 11:54 AM

## 2022-11-02 LAB — CBC WITH DIFFERENTIAL/PLATELET
Basophils Absolute: 0.1 10*3/uL (ref 0.0–0.2)
Basos: 1 %
EOS (ABSOLUTE): 0.5 10*3/uL — ABNORMAL HIGH (ref 0.0–0.4)
Eos: 8 %
Hematocrit: 47 % (ref 37.5–51.0)
Hemoglobin: 15.6 g/dL (ref 13.0–17.7)
Immature Grans (Abs): 0 10*3/uL (ref 0.0–0.1)
Immature Granulocytes: 0 %
Lymphocytes Absolute: 1.9 10*3/uL (ref 0.7–3.1)
Lymphs: 30 %
MCH: 29.9 pg (ref 26.6–33.0)
MCHC: 33.2 g/dL (ref 31.5–35.7)
MCV: 90 fL (ref 79–97)
Monocytes Absolute: 0.4 10*3/uL (ref 0.1–0.9)
Monocytes: 7 %
Neutrophils Absolute: 3.4 10*3/uL (ref 1.4–7.0)
Neutrophils: 54 %
Platelets: 247 10*3/uL (ref 150–450)
RBC: 5.22 x10E6/uL (ref 4.14–5.80)
RDW: 13.9 % (ref 11.6–15.4)
WBC: 6.3 10*3/uL (ref 3.4–10.8)

## 2022-11-02 LAB — LIPID PANEL
Chol/HDL Ratio: 3.6 ratio (ref 0.0–5.0)
Cholesterol, Total: 176 mg/dL (ref 100–199)
HDL: 49 mg/dL (ref >39)
LDL Chol Calc (NIH): 107 mg/dL — ABNORMAL HIGH (ref 0–99)
Triglycerides: 112 mg/dL (ref 0–149)
VLDL Cholesterol Cal: 20 mg/dL (ref 5–40)

## 2022-11-02 LAB — CMP14+EGFR
ALT: 15 IU/L (ref 0–44)
AST: 17 IU/L (ref 0–40)
Albumin/Globulin Ratio: 1.7 (ref 1.2–2.2)
Albumin: 4.5 g/dL (ref 3.8–4.9)
Alkaline Phosphatase: 74 IU/L (ref 44–121)
BUN/Creatinine Ratio: 15 (ref 9–20)
BUN: 15 mg/dL (ref 6–24)
Bilirubin Total: 0.6 mg/dL (ref 0.0–1.2)
CO2: 25 mmol/L (ref 20–29)
Calcium: 9.5 mg/dL (ref 8.7–10.2)
Chloride: 100 mmol/L (ref 96–106)
Creatinine, Ser: 1.02 mg/dL (ref 0.76–1.27)
Globulin, Total: 2.7 g/dL (ref 1.5–4.5)
Glucose: 94 mg/dL (ref 70–99)
Potassium: 4.5 mmol/L (ref 3.5–5.2)
Sodium: 139 mmol/L (ref 134–144)
Total Protein: 7.2 g/dL (ref 6.0–8.5)
eGFR: 88 mL/min/{1.73_m2} (ref >59)

## 2022-11-02 LAB — PSA, TOTAL AND FREE
PSA, Free Pct: 42.9 %
PSA, Free: 0.3 ng/mL
Prostate Specific Ag, Serum: 0.7 ng/mL (ref 0.0–4.0)

## 2022-11-02 LAB — TSH: TSH: 2.33 u[IU]/mL (ref 0.450–4.500)

## 2022-11-05 ENCOUNTER — Encounter: Payer: Self-pay | Admitting: *Deleted

## 2023-01-24 ENCOUNTER — Ambulatory Visit (INDEPENDENT_AMBULATORY_CARE_PROVIDER_SITE_OTHER): Payer: 59 | Admitting: Family Medicine

## 2023-01-24 ENCOUNTER — Encounter: Payer: Self-pay | Admitting: Family Medicine

## 2023-01-24 VITALS — BP 129/87 | HR 95 | Ht 72.0 in | Wt 198.0 lb

## 2023-01-24 DIAGNOSIS — J301 Allergic rhinitis due to pollen: Secondary | ICD-10-CM | POA: Diagnosis not present

## 2023-01-24 MED ORDER — FLUTICASONE PROPIONATE 50 MCG/ACT NA SUSP: 1.0000 | Freq: Two times a day (BID) | NASAL | 6 refills | Status: DC | PRN

## 2023-01-24 MED ORDER — LORATADINE 10 MG PO TABS: 10.0000 mg | ORAL_TABLET | Freq: Every day | ORAL | 3 refills | Status: AC

## 2023-01-24 MED ORDER — BENZONATATE 200 MG PO CAPS: 200.0000 mg | ORAL_CAPSULE | Freq: Two times a day (BID) | ORAL | 0 refills | Status: DC | PRN

## 2023-01-24 NOTE — Progress Notes (Signed)
BP 129/87   Pulse 95   Ht 6' (1.829 m)   Wt 198 lb (89.8 kg)   SpO2 95%   BMI 26.85 kg/m    Subjective:   Patient ID: Wesley Baxter, male    DOB: [birth year], 52 y.o.   MRN: [id number]  HPI: Wesley Baxter is a 52 y.o. male presenting on 01/24/2023 for Cough   HPI Patient is coming in today with cough, he says been fighting the cough off and on for about a month sometimes gets down in his chest.  He did see an urgent care a couple weeks ago and they gave him amoxicillin and that helped some but then it came right back.  He has been using Sudafed and DayQuil and they do not seem to be helping as much.  He denies any shortness of breath or wheezing or fevers or chills.  He otherwise does not Nestl feel sick except for he has these coughing spells  Relevant past medical, surgical, family and social history reviewed and updated as indicated. Interim medical history since our last visit reviewed. Allergies and medications reviewed and updated.  Review of Systems  Constitutional:  Negative for chills and fever.  HENT:  Negative for congestion, ear discharge, ear pain, postnasal drip, rhinorrhea, sinus pressure, sneezing, sore throat and voice change.   Eyes:  Negative for pain, discharge, redness and visual disturbance.  Respiratory:  Positive for cough. Negative for shortness of breath and wheezing.   Cardiovascular:  Negative for chest pain and leg swelling.  Musculoskeletal:  Negative for gait problem.  Skin:  Negative for rash.  All other systems reviewed and are negative.   Per HPI unless specifically indicated above   Allergies as of 01/24/2023       Reactions   Alpha-gal         Medication List        Accurate as of January 24, 2023  1:57 PM. If you have any questions, ask your nurse or doctor.          benzonatate 200 MG capsule Commonly known as: TESSALON Take 1 capsule (200 mg total) by mouth 2 (two) times daily as needed for cough. Started by:  Nils Pyle, MD   cetirizine 10 MG tablet Commonly known as: ZYRTEC Take 10 mg by mouth as needed for allergies.   fluticasone 50 MCG/ACT nasal spray Commonly known as: FLONASE Place 1 spray into both nostrils 2 (two) times daily as needed for allergies or rhinitis. Started by: Nils Pyle, MD   loratadine 10 MG tablet Commonly known as: CLARITIN Take 1 tablet (10 mg total) by mouth daily. Started by: Nils Pyle, MD   omeprazole 20 MG capsule Commonly known as: PRILOSEC Take 20 mg by mouth every morning.   triamcinolone ointment 0.1 % Commonly known as: KENALOG Apply topically twice daily to BODY as needed for red, sandpaper like rash.  Do not use on face, groin or armpits.         Objective:   BP 129/87   Pulse 95   Ht 6' (1.829 m)   Wt 198 lb (89.8 kg)   SpO2 95%   BMI 26.85 kg/m   Wt Readings from Last 3 Encounters:  01/24/23 198 lb (89.8 kg)  11/01/22 201 lb (91.2 kg)  09/03/22 191 lb (86.6 kg)    Physical Exam Vitals and nursing note reviewed.  Constitutional:      General: He is not in acute  distress.    Appearance: He is well-developed. He is not diaphoretic.  Eyes:     General: No scleral icterus.    Conjunctiva/sclera: Conjunctivae normal.  Neck:     Thyroid: No thyromegaly.  Cardiovascular:     Rate and Rhythm: Normal rate and regular rhythm.     Heart sounds: Normal heart sounds. No murmur heard. Pulmonary:     Effort: Pulmonary effort is normal. No respiratory distress.     Breath sounds: Normal breath sounds. No wheezing.  Musculoskeletal:        General: Normal range of motion.     Cervical back: Neck supple.  Lymphadenopathy:     Cervical: No cervical adenopathy.  Skin:    General: Skin is warm and dry.     Findings: No rash.  Neurological:     Mental Status: He is alert and oriented to person, place, and time.     Coordination: Coordination normal.  Psychiatric:        Behavior: Behavior normal.        Assessment & Plan:   Problem List Items Addressed This Visit   None Visit Diagnoses     Non-seasonal allergic rhinitis due to pollen    -  Primary   Relevant Medications   benzonatate (TESSALON) 200 MG capsule   loratadine (CLARITIN) 10 MG tablet   fluticasone (FLONASE) 50 MCG/ACT nasal spray       Will do cough medicine and claritin and tessalon Follow up plan: Return if symptoms worsen or fail to improve.  Counseling provided for all of the vaccine components No orders of the defined types were placed in this encounter.   Arville Care, MD [facility] 01/24/2023, 1:57 PM

## 2023-02-21 ENCOUNTER — Telehealth: Payer: Self-pay | Admitting: *Deleted

## 2023-02-21 NOTE — Telephone Encounter (Signed)
  Procedure: Colonoscopy  Height: 6'0 Weight: 200lbs       Have you had a colonoscopy before?  no  Do you have family history of colon cancer?  no  Do you have a family history of polyps? yes  Previous colonoscopy with polyps removed? N/a  Do you have a history colorectal cancer?   no  Are you diabetic?  no  Do you have a prosthetic or mechanical heart valve? no  Do you have a pacemaker/defibrillator?   no  Have you had endocarditis/atrial fibrillation?  no  Do you use supplemental oxygen/CPAP?  no  Have you had joint replacement within the last 12 months?  no  Do you tend to be constipated or have to use laxatives?  no   Do you have history of alcohol use? If yes, how much and how often.  no  Do you have history or are you using drugs? If yes, what do are you  using?  no  Have you ever had a stroke/heart attack?  no  Have you ever had a heart or other vascular stent placed,?no  Do you take weight loss medication? no  Do you take any blood-thinning medications such as: (Plavix, aspirin, Coumadin, Aggrenox, Brilinta, Xarelto, Eliquis, Pradaxa, Savaysa or Effient)? no  If yes we need the name, milligram, dosage and who is prescribing doctor:               Current Outpatient Medications  Medication Sig Dispense Refill   loratadine (CLARITIN) 10 MG tablet Take 1 tablet (10 mg total) by mouth daily. 90 tablet 3   omeprazole (PRILOSEC) 20 MG capsule Take 20 mg by mouth every morning.     No current facility-administered medications for this visit.    Allergies  Allergen Reactions   Alpha-Gal

## 2023-02-28 ENCOUNTER — Telehealth: Payer: Self-pay | Admitting: Internal Medicine

## 2023-02-28 NOTE — Telephone Encounter (Signed)
Questionnaire received and sent to provider.

## 2023-02-28 NOTE — Telephone Encounter (Signed)
Duplicate request. See prior phone note. Triage already entered

## 2023-03-11 NOTE — Telephone Encounter (Signed)
Appropriate, ASA 2.  

## 2023-03-12 MED ORDER — PEG 3350-KCL-NA BICARB-NACL 420 G PO SOLR: 4000.0000 mL | Freq: Once | ORAL | 0 refills | Status: AC

## 2023-03-12 NOTE — Addendum Note (Signed)
Addended by: Armstead Peaks on: 03/12/2023 09:26 AM   Modules accepted: Orders

## 2023-03-12 NOTE — Telephone Encounter (Signed)
Referral completed

## 2023-03-12 NOTE — Telephone Encounter (Addendum)
CALLED SPOKE WITH PT. He has been scheduled with Dr. Marletta Lor 7/2. Aware will send prep rx to pharmacy. Instructions sent via mychart.   Per [facility] PA TCS: "CPT Code GECOL Description: Colonoscopy Case Number: [id number] Review Date: 03/12/2023 9:25:52 AM Expiration Date: N/A Status: This member is not in scope for prior-authorization/notification for the services requested. You can save the case reference ID as validation of your request."

## 2023-03-25 ENCOUNTER — Telehealth: Payer: Self-pay | Admitting: *Deleted

## 2023-03-25 NOTE — Telephone Encounter (Signed)
Spoke with pt. Aware Dr. Marletta Lor not available for procedure on 7/2. Rescheduled for 7/16. Also aware will send new instructions via mychart. Endo also sent message.

## 2023-04-16 ENCOUNTER — Ambulatory Visit (HOSPITAL_COMMUNITY)
Admission: RE | Admit: 2023-04-16 | Discharge: 2023-04-16 | Disposition: A | Discharge status: Home / Self Care | Payer: 59 | Attending: Internal Medicine | Admitting: Internal Medicine

## 2023-04-16 ENCOUNTER — Ambulatory Visit (HOSPITAL_COMMUNITY): Payer: 59 | Admitting: Anesthesiology

## 2023-04-16 ENCOUNTER — Ambulatory Visit (HOSPITAL_BASED_OUTPATIENT_CLINIC_OR_DEPARTMENT_OTHER): Payer: 59 | Admitting: Anesthesiology

## 2023-04-16 ENCOUNTER — Encounter (HOSPITAL_COMMUNITY): Payer: Self-pay

## 2023-04-16 ENCOUNTER — Other Ambulatory Visit: Payer: Self-pay

## 2023-04-16 ENCOUNTER — Encounter (HOSPITAL_COMMUNITY)
Admission: RE | Disposition: A | Discharge status: Home / Self Care | Payer: Self-pay | Source: Home / Self Care | Attending: Internal Medicine

## 2023-04-16 DIAGNOSIS — D126 Benign neoplasm of colon, unspecified: Secondary | ICD-10-CM

## 2023-04-16 DIAGNOSIS — Z1211 Encounter for screening for malignant neoplasm of colon: Secondary | ICD-10-CM

## 2023-04-16 DIAGNOSIS — K648 Other hemorrhoids: Secondary | ICD-10-CM | POA: Diagnosis not present

## 2023-04-16 DIAGNOSIS — K635 Polyp of colon: Secondary | ICD-10-CM | POA: Insufficient documentation

## 2023-04-16 DIAGNOSIS — K219 Gastro-esophageal reflux disease without esophagitis: Secondary | ICD-10-CM | POA: Insufficient documentation

## 2023-04-16 DIAGNOSIS — D122 Benign neoplasm of ascending colon: Secondary | ICD-10-CM | POA: Insufficient documentation

## 2023-04-16 DIAGNOSIS — D123 Benign neoplasm of transverse colon: Secondary | ICD-10-CM

## 2023-04-16 DIAGNOSIS — D120 Benign neoplasm of cecum: Secondary | ICD-10-CM | POA: Insufficient documentation

## 2023-04-16 HISTORY — PX: COLONOSCOPY WITH PROPOFOL: SHX5780

## 2023-04-16 HISTORY — PX: POLYPECTOMY: SHX5525

## 2023-04-16 HISTORY — PX: COLONOSCOPY: SHX174

## 2023-04-16 SURGERY — COLONOSCOPY WITH PROPOFOL
Anesthesia: General

## 2023-04-16 MED ORDER — STERILE WATER FOR IRRIGATION IR SOLN
Status: DC | PRN
  Administered 2023-04-16: 120 mL

## 2023-04-16 MED ORDER — PROPOFOL 10 MG/ML IV BOLUS
INTRAVENOUS | Status: DC | PRN
  Administered 2023-04-16: 20 mg via INTRAVENOUS
  Administered 2023-04-16: 150 mg via INTRAVENOUS

## 2023-04-16 MED ORDER — LACTATED RINGERS IV SOLN: INTRAVENOUS | Status: DC

## 2023-04-16 MED ORDER — PROPOFOL 500 MG/50ML IV EMUL
INTRAVENOUS | Status: DC | PRN
  Administered 2023-04-16: 100 ug/kg/min via INTRAVENOUS

## 2023-04-16 NOTE — Anesthesia Preprocedure Evaluation (Addendum)
Anesthesia Evaluation  Patient identified by MRN, date of birth, ID band Patient awake    Reviewed: Allergy & Precautions, H&P , NPO status , Patient's Chart, lab work & pertinent test results  Airway Mallampati: II  TM Distance: >3 FB Neck ROM: Full    Dental  (+) Dental Advisory Given [place] :   Pulmonary neg pulmonary ROS   Pulmonary exam normal breath sounds clear to auscultation       Cardiovascular negative cardio ROS Normal cardiovascular exam Rhythm:Regular Rate:Normal     Neuro/Psych negative neurological ROS  negative psych ROS   GI/Hepatic Neg liver ROS,GERD  Medicated and Controlled,,  Endo/Other  negative endocrine ROS    Renal/GU negative Renal ROS  negative genitourinary   Musculoskeletal negative musculoskeletal ROS (+)    Abdominal   Peds negative pediatric ROS (+)  Hematology negative hematology ROS (+)   Anesthesia Other Findings   Reproductive/Obstetrics negative OB ROS                             Anesthesia Physical Anesthesia Plan  ASA: 1  Anesthesia Plan: General   Post-op Pain Management: Minimal or no pain anticipated   Induction: Intravenous  PONV Risk Score and Plan: Treatment may vary due to age or medical condition  Airway Management Planned: Nasal Cannula and Natural Airway  Additional Equipment:   Intra-op Plan:   Post-operative Plan:   Informed Consent: I have reviewed the patients History and Physical, chart, labs and discussed the procedure including the risks, benefits and alternatives for the proposed anesthesia with the patient or authorized representative who has indicated his/her understanding and acceptance.     Dental advisory given  Plan Discussed with: CRNA and Surgeon  Anesthesia Plan Comments:        Anesthesia Quick Evaluation

## 2023-04-16 NOTE — Discharge Instructions (Addendum)
  Colonoscopy Discharge Instructions  Read the instructions outlined below and refer to this sheet in the next few weeks. These discharge instructions provide you with general information on caring for yourself after you leave the hospital. Your doctor may also give you specific instructions. While your treatment has been planned according to the most current medical practices available, unavoidable complications occasionally occur.   ACTIVITY You may resume your regular activity, but move at a slower pace for the next 24 hours.  Take frequent rest periods for the next 24 hours.  Walking will help get rid of the air and reduce the bloated feeling in your belly (abdomen).  No driving for 24 hours (because of the medicine (anesthesia) used during the test).   Do not sign any important legal documents or operate any machinery for 24 hours (because of the anesthesia used during the test).  NUTRITION Drink plenty of fluids.  You may resume your normal diet as instructed by your doctor.  Begin with a light meal and progress to your normal diet. Heavy or fried foods are harder to digest and may make you feel sick to your stomach (nauseated).  Avoid alcoholic beverages for 24 hours or as instructed.  MEDICATIONS You may resume your normal medications unless your doctor tells you otherwise.  WHAT YOU CAN EXPECT TODAY Some feelings of bloating in the abdomen.  Passage of more gas than usual.  Spotting of blood in your stool or on the toilet paper.  IF YOU HAD POLYPS REMOVED DURING THE COLONOSCOPY: No aspirin products for 7 days or as instructed.  No alcohol for 7 days or as instructed.  Eat a soft diet for the next 24 hours.  FINDING OUT THE RESULTS OF YOUR TEST Not all test results are available during your visit. If your test results are not back during the visit, make an appointment with your caregiver to find out the results. Do not assume everything is normal if you have not heard from your  caregiver or the medical facility. It is important for you to follow up on all of your test results.  SEEK IMMEDIATE MEDICAL ATTENTION IF: You have more than a spotting of blood in your stool.  Your belly is swollen (abdominal distention).  You are nauseated or vomiting.  You have a temperature over 101.  You have abdominal pain or discomfort that is severe or gets worse throughout the day.   Your colonoscopy revealed 5 small polyp(s) which I removed successfully. Await pathology results, my office will contact you. I recommend repeating colonoscopy in 5 years for surveillance purposes. Otherwise follow up with GI as needed   I hope you have a great rest of your week!  Elon Alas. Abbey Chatters, D.O. Gastroenterology and Hepatology [facility]

## 2023-04-16 NOTE — H&P (Signed)
Primary Care Physician:  Dettinger, Elige Radon, MD Primary Gastroenterologist:  Dr. Marletta Lor  Pre-Procedure History & Physical: HPI:  Wesley Baxter is a 52 y.o. male is here for first ever colonoscopy for colon cancer screening purposes.  Patient denies any family history of colorectal cancer.  No melena or hematochezia.  No abdominal pain or unintentional weight loss.  No change in bowel habits.  Overall feels well from a GI standpoint.  Past Medical History:  Diagnosis Date   Allergy    Hearing loss     Past Surgical History:  Procedure Laterality Date   ROTATOR CUFF REPAIR Right    in 1998 and again in 2003    Prior to Admission medications   Medication Sig Start Date End Date Taking? Authorizing Provider  omeprazole (PRILOSEC) 20 MG capsule Take 20 mg by mouth every morning.   Yes [provider]  loratadine (CLARITIN) 10 MG tablet Take 1 tablet (10 mg total) by mouth daily. Patient not taking: Reported on 04/11/2023 01/24/23   Dettinger, Elige Radon, MD    Allergies as of 03/12/2023 - Review Complete 01/24/2023  Allergen Reaction Noted   Alpha-gal  09/03/2022    Family History  Problem Relation Age of Onset   Hyperlipidemia Mother    Hearing loss Father    Cancer Maternal Grandmother    Cancer Maternal Grandfather    Early death Maternal Grandfather 71   Cancer Paternal Grandfather     Social History   Socioeconomic History   Marital status: Married    Spouse name: Not on file   Number of children: Not on file   Years of education: Not on file   Highest education level: Not on file  Occupational History   Not on file  Tobacco Use   Smoking status: Never   Smokeless tobacco: Never  Vaping Use   Vaping status: Never Used  Substance and Sexual Activity   Alcohol use: No   Drug use: No   Sexual activity: Yes    Birth control/protection: Post-menopausal  Other Topics Concern   Not on file  Social History Narrative   Not on file   Social  Determinants of Health   Financial Resource Strain: Not on file  Food Insecurity: Not on file  Transportation Needs: Not on file  Physical Activity: Not on file  Stress: Not on file  Social Connections: Unknown (02/13/2022)   Received from [facility]   Social Network    Social Network: Not on file  Intimate Partner Violence: Unknown (01/05/2022)   Received from [facility]   HITS    Physically Hurt: Not on file    Insult or Talk Down To: Not on file    Threaten Physical Harm: Not on file    Scream or Curse: Not on file    Review of Systems: See HPI, otherwise negative ROS  Physical Exam: Vital signs in last 24 hours: Temp:  [97.9 F (36.6 C)] 97.9 F (36.6 C) (07/16 0746) Pulse Rate:  [71] 71 (07/16 0746) Resp:  [16] 16 (07/16 0746) BP: (123)/(93) 123/93 (07/16 0746) SpO2:  [100 %] 100 % (07/16 0746) Weight:  [90.7 kg] 90.7 kg (07/16 0746)   General:   Alert,  Well-developed, well-nourished, pleasant and cooperative in NAD Head:  Normocephalic and atraumatic. Eyes:  Sclera clear, no icterus.   Conjunctiva pink. Ears:  Normal auditory acuity. Nose:  No deformity, discharge,  or lesions. Msk:  Symmetrical without gross deformities. Normal posture. Extremities:  Without  clubbing or edema. Neurologic:  Alert and  oriented x4;  grossly normal neurologically. Skin:  Intact without significant lesions or rashes. Psych:  Alert and cooperative. Normal mood and affect.  Impression/Plan: Wesley Baxter is here for a colonoscopy to be performed for colon cancer screening purposes.  The risks of the procedure including infection, bleed, or perforation as well as benefits, limitations, alternatives and imponderables have been reviewed with the patient. Questions have been answered. All parties agreeable.

## 2023-04-16 NOTE — Op Note (Signed)
[facility] Patient Name: Wesley Baxter Procedure Date: 04/16/2023 8:54 AM MRN: [id number] Date of Birth: 02-22-1971 Attending MD: Hennie Duos. Marletta Lor , [place], [id number] CSN: [id number] Age: 52 Admit Type: Outpatient Procedure:                Colonoscopy Indications:              Screening for colorectal malignant neoplasm Providers:                Hennie Duos. Marletta Lor, DO, Sheran Fava, Pandora Leiter, Technician Referring MD:              Medicines:                See the Anesthesia note for documentation of the                            administered medications Complications:            No immediate complications. Estimated Blood Loss:     Estimated blood loss was minimal. Procedure:                Pre-Anesthesia Assessment:                           - The anesthesia plan was to use monitored                            anesthesia care (MAC).                           After obtaining informed consent, the colonoscope                            was passed under direct vision. Throughout the                            procedure, the patient's blood pressure, pulse, and                            oxygen saturations were monitored continuously. The                            [id number]-[id number] ([id number]) was introduced through the                            anus and advanced to the the cecum, identified by                            appendiceal orifice and ileocecal valve. The                            colonoscopy was performed without difficulty. The                            patient tolerated the procedure well. The quality  of the bowel preparation was evaluated using the                            BBPS [facility] Bowel Preparation Scale) with scores                            of: Right Colon = 3, Transverse Colon = 3 and Left                            Colon = 3 (entire mucosa seen well with no residual                             staining, small fragments of stool or opaque                            liquid). The total BBPS score equals 9. Scope In: 9:13:44 AM Scope Out: 9:27:55 AM Scope Withdrawal Time: 0 hours 12 minutes 27 seconds  Total Procedure Duration: 0 hours 14 minutes 11 seconds  Findings:      Non-bleeding internal hemorrhoids were found during endoscopy.      Four sessile polyps were found in the ascending colon and cecum. The       polyps were 3 to 4 mm in size. These polyps were removed with a cold       snare. Resection and retrieval were complete.      A 3 mm polyp was found in the transverse colon. The polyp was sessile.       The polyp was removed with a cold snare. Resection and retrieval were       complete.      The exam was otherwise without abnormality. Impression:               - Non-bleeding internal hemorrhoids.                           - Four 3 to 4 mm polyps in the ascending colon and                            in the cecum, removed with a cold snare. Resected                            and retrieved.                           - One 3 mm polyp in the transverse colon, removed                            with a cold snare. Resected and retrieved.                           - The examination was otherwise normal. Moderate Sedation:      Per Anesthesia Care Recommendation:           - Patient has a contact number available for  emergencies. The signs and symptoms of potential                            delayed complications were discussed with the                            patient. Return to normal activities tomorrow.                            Written discharge instructions were provided to the                            patient.                           - Resume previous diet.                           - Continue present medications.                           - Await pathology results.                           - Repeat colonoscopy in 5 years for  surveillance.                           - Return to GI clinic PRN. Procedure Code(s):        --- Professional ---                           [phone], Colonoscopy, flexible; with removal of                            tumor(s), polyp(s), or other lesion(s) by snare                            technique Diagnosis Code(s):        --- Professional ---                           Z12.11, Encounter for screening for malignant                            neoplasm of colon                           D12.2, Benign neoplasm of ascending colon                           D12.0, Benign neoplasm of cecum                           D12.3, Benign neoplasm of transverse colon (hepatic                            flexure or splenic flexure)  K64.8, Other hemorrhoids CPT copyright 2022 American Medical Association. All rights reserved. The codes documented in this report are preliminary and upon coder review may  be revised to meet current compliance requirements. Hennie Duos. Marletta Lor, DO Hennie Duos. Marletta Lor, DO 04/16/2023 9:31:52 AM This report has been signed electronically. Number of Addenda: 0

## 2023-04-16 NOTE — Progress Notes (Addendum)
FOLLOW UP Date of Service/Encounter:  04/18/23   Subjective:  Wesley Baxter (DOB: March 31, 1971) is a 52 y.o. male who [facility] in re-evaluation of the following: Rash, positive alpha gal testing  History obtained from: chart review and patient.  For Review, LV was on 10/18/22  with Dr.Castle Lamons seen for routine follow-up. See below for summary of history and diagnostics.  Therapeutic plans/changes recommended: we continued triamcinolone and ongoing contact allergen avoidance.  Discussed using AH for itch control.  ----------------------------------------------------- Pertinent History/Diagnostics:  Rash:  September 2023 rash started. Initially told poison ivy contact dermatitis. Started on his calf and lasted weeks. Resolved and left hypopigemnted patch. Benadryl failed.  PCP ordered labs: alpha-gal + 1.02, beef 0.17 IgE elevated, but below + threshold. Lamb and pork negative. He cut out beef, but rash did not resolve.  However, he feels rash worsens after eating mammalian meat. However, continues eating beef and pork without other symptoms.  - TRUE patch testing + neomycin,  formaldehyde, imidazolidinyl urea --------------------------------------------------- Today presents for follow-up. He continues to have intermittent rash that comes and goes on his arms, lower legs and upper back.   He does have a leather couch which he feels makes his symptoms worse so he using a cover over his couch to protect his skin.  This seems to help some.  Despite this, he continues to have intermittent breakouts. He will eat pork around once per week.  It has been probably a year since he had a hamburger, and he is nervous to reintroduce beef. He has never had symptoms concerning for an anaphylactic type reaction, and therefore I do not think that his alpha gal testing represents a true alpha gal allergy.  He would be interested in retesting for  reassurance. He did try strictly avoiding pork and beef for a few weeks, but it didn't seem to improve his rash.  Allergies as of 04/18/2023       Reactions   Alpha-gal         Medication List        Accurate as of April 18, 2023  2:28 PM. If you have any questions, ask your nurse or doctor.          loratadine 10 MG tablet Commonly known as: CLARITIN Take 1 tablet (10 mg total) by mouth daily.   omeprazole 20 MG capsule Commonly known as: PRILOSEC Take 20 mg by mouth every morning.   triamcinolone ointment 0.1 % Commonly known as: KENALOG Apply sparingly to affected areas twice daily as needed below face and neck Started by: Verlee Monte       Past Medical History:  Diagnosis Date   Allergy    Hearing loss    Past Surgical History:  Procedure Laterality Date   COLONOSCOPY  04/16/2023   ROTATOR CUFF REPAIR Right    in 1998 and again in 2003   Otherwise, there have been no changes to his past medical history, surgical history, family history, or social history.  ROS: All others negative except as noted per HPI.   Objective:  BP 130/82   Pulse 64   Temp 98.4 F (36.9 C) (Temporal)   Resp 12   SpO2 98%  There is no height or weight on file to calculate BMI. Physical Exam: General Appearance:  Alert, cooperative, no distress, appears stated age  Head:  Normocephalic, without obvious abnormality, atraumatic  Eyes:  Conjunctiva clear, EOM's intact  Nose: Nares  normal, hypertrophic turbinates  Throat: Lips, tongue normal; teeth and gums normal, normal posterior oropharynx  Neck: Supple, symmetrical  Lungs:   clear to auscultation bilaterally, Respirations unlabored, no coughing  Heart:  regular rate and rhythm and no murmur, Appears well perfused  Extremities: No edema  Skin: Continue as scattered erythematous and dry patches scattered on bilateral lower extremities, upper back, and forearms bilaterally  Neurologic: No gross deficits   Labs: Alpha gal  allergen   Assessment/Plan   Rash: atopic dermatitis with contact dermatitis. Not at goal - continue to use topical ointments as prescribed -   - triamcinolone 0.1% twice daily as needed - use betamethasone only for severe/stubborn rash twice daily as needed. - allergen avoidance is main measure of treatment for contact dermatitis, avoid the following:  - patch testing positive to: neomycin,  formaldehyde, imidazolidinyl urea - can start zyrtec 10 mg twice daily, max dose 20 mg (2 tablets) twice daily for itching   Other interchangeable options: Zyrtec (cetirizine) 10 mg, Claritin (loratadine) 10 mg, Xyzal (levocetirizine) 5 mg or Allegra (fexofenadine) 180 mg daily as needed If not controlled with the above, consider adding dupixent (injectable medication for eczema)  Alpha-gal testing positive-suspect false positive, has never had allergic reaction.  - unlikely alpha gal based on history, however your testing was positive to alpha gal--since you have not really cut out mammalian meat from your diet and are not experiencing symptoms, it is okay to continue to eat mammalian meat.  Should you have any symptoms concerning for an allergic reaction after eating pork, beef, lamb, etc. please discontinue eating all mammalian meat and let us know - will retest today.  Follow-up in 6 months, sooner if needed.  Tonny Bollman, MD  [facility]

## 2023-04-16 NOTE — Anesthesia Procedure Notes (Signed)
Date/Time: 04/16/2023 9:08 AM  Performed by: Franco Nones, CRNAPre-anesthesia Checklist: Patient identified, Emergency Drugs available, Suction available, Timeout performed and Patient being monitored Patient Re-evaluated:Patient Re-evaluated prior to induction Oxygen Delivery Method: Nasal Cannula

## 2023-04-16 NOTE — Anesthesia Postprocedure Evaluation (Signed)
Anesthesia Post Note  Patient: Teodor Prater  Procedure(s) Performed: COLONOSCOPY WITH PROPOFOL POLYPECTOMY  Patient location during evaluation: Phase II Anesthesia Type: General Level of consciousness: awake and alert and oriented Pain management: pain level controlled Vital Signs Assessment: post-procedure vital signs reviewed and stable Respiratory status: spontaneous breathing, nonlabored ventilation and respiratory function stable Cardiovascular status: blood pressure returned to baseline and stable Postop Assessment: no apparent nausea or vomiting Anesthetic complications: no  No notable events documented.   Last Vitals:  Vitals:   04/16/23 0746 04/16/23 0931  BP: (!) 123/93 105/78  Pulse: 71 78  Resp: 16 17  Temp: 36.6 C 36.4 C  SpO2: 100% 96%    Last Pain:  Vitals:   04/16/23 0931  TempSrc: Oral  PainSc: 0-No pain                 Joshawn Crissman C Joscelynn Brutus

## 2023-04-16 NOTE — Transfer of Care (Signed)
Immediate Anesthesia Transfer of Care Note  Patient: Wesley Baxter  Procedure(s) Performed: COLONOSCOPY WITH PROPOFOL POLYPECTOMY  Patient Location: Endoscopy Unit  Anesthesia Type:General  Level of Consciousness: drowsy  Airway & Oxygen Therapy: Patient Spontanous Breathing  Post-op Assessment: Report given to RN and Post -op Vital signs reviewed and stable  Post vital signs: Reviewed and stable  Last Vitals:  Vitals Value Taken Time  BP 105/78 04/16/23 0931  Temp 36.4 C 04/16/23 0931  Pulse 78 04/16/23 0931  Resp 17 04/16/23 0931  SpO2 96 % 04/16/23 0931    Last Pain:  Vitals:   04/16/23 0931  TempSrc: Oral  PainSc:       Patients Stated Pain Goal: 8 (04/16/23 0746)  Complications: No notable events documented.

## 2023-04-18 ENCOUNTER — Encounter: Payer: Self-pay | Admitting: Internal Medicine

## 2023-04-18 ENCOUNTER — Ambulatory Visit (INDEPENDENT_AMBULATORY_CARE_PROVIDER_SITE_OTHER): Payer: 59 | Admitting: Internal Medicine

## 2023-04-18 VITALS — BP 130/82 | HR 64 | Temp 98.4°F | Resp 12

## 2023-04-18 DIAGNOSIS — L2389 Allergic contact dermatitis due to other agents: Secondary | ICD-10-CM

## 2023-04-18 DIAGNOSIS — L2084 Intrinsic (allergic) eczema: Secondary | ICD-10-CM

## 2023-04-18 DIAGNOSIS — Z91018 Allergy to other foods: Secondary | ICD-10-CM

## 2023-04-18 LAB — SURGICAL PATHOLOGY

## 2023-04-18 MED ORDER — TRIAMCINOLONE ACETONIDE 0.1 % EX OINT: TOPICAL_OINTMENT | CUTANEOUS | 1 refills | Status: DC

## 2023-04-18 NOTE — Patient Instructions (Addendum)
Rash: atopic dermatitis with contact dermatitis. - continue to use topical ointments as prescribed -   - triamcinolone 0.1% twice daily as needed - use betamethasone only for severe/stubborn rash twice daily as needed. - allergen avoidance is main measure of treatment for contact dermatitis, avoid the following:  - patch testing positive to: neomycin,  formaldehyde, imidazolidinyl urea - can start zyrtec 10 mg twice daily, max dose 20 mg (2 tablets) twice daily for itching   Other interchangeable options: Zyrtec (cetirizine) 10 mg, Claritin (loratadine) 10 mg, Xyzal (levocetirizine) 5 mg or Allegra (fexofenadine) 180 mg daily as needed If not controlled with the above, consider adding dupixent (injectable medication for eczema)   Alpha-gal testing positive-suspect false positive, has never had allergic reaction.  - unlikely alpha gal based on history, however your testing was positive to alpha gal--since you have not really cut out mammalian meat from your diet and are not experiencing symptoms, it is okay to continue to eat mammalian meat.  Should you have any symptoms concerning for an allergic reaction after eating pork, beef, lamb, etc. please discontinue eating all mammalian meat and let us know - will retest today.  Follow-up in 6 months, sooner if needed.  It was wonderful seeing you again today! Thank you for letting me participate in your care. Tonny Bollman, MD [facility] 

## 2023-04-22 ENCOUNTER — Encounter (HOSPITAL_COMMUNITY): Payer: Self-pay | Admitting: Internal Medicine

## 2023-04-22 LAB — ALPHA-GAL PANEL
Allergen Lamb IgE: <0.1 kU/L
Beef IgE: 0.19 kU/L — AB
IgE (Immunoglobulin E), Serum: 10 IU/mL (ref 6–495)
O215-IgE Alpha-Gal: 0.72 kU/L — AB
Pork IgE: <0.1 kU/L

## 2023-04-22 NOTE — Progress Notes (Signed)
Please let Wesley Baxter know that his alpha gal numbers remain very low.  It has decreased slightly from last year (was 1.02, now 0.72).  Also his beef, pork and lamb are considered negative (less than 0.35).  Based on his history, it is okay for him to eat mammalian meat. If he would feel more comfortable eating first in clinic, we can arrange for that.

## 2023-05-03 ENCOUNTER — Telehealth: Payer: Self-pay | Admitting: Family Medicine

## 2023-05-03 DIAGNOSIS — Z0279 Encounter for issue of other medical certificate: Secondary | ICD-10-CM

## 2023-05-03 NOTE — Telephone Encounter (Signed)
Deveron dropped off physical forms to be completed and signed.  Form Fee Paid? (Y/N)       yes

## 2023-08-02 ENCOUNTER — Telehealth: Payer: Self-pay | Admitting: Internal Medicine

## 2023-08-02 MED ORDER — TRIAMCINOLONE ACETONIDE 0.1 % EX OINT: TOPICAL_OINTMENT | CUTANEOUS | 1 refills | Status: DC

## 2023-08-02 MED ORDER — TRIAMCINOLONE ACETONIDE 0.1 % EX OINT: TOPICAL_OINTMENT | CUTANEOUS | 1 refills | Status: AC

## 2023-08-02 NOTE — Telephone Encounter (Signed)
Sent refill for triamcinolone into the pharmacy. Called and notified patient.  Wesley Baxter [phone]

## 2023-08-02 NOTE — Telephone Encounter (Signed)
Patient asking for a refill on RX triamcinolone ointment (KENALOG) 0.1 % [[id number]] please send to [facility] [address], [place] - [address], [place] [address] Phone: [phone]  Fax: [phone]

## 2023-10-21 NOTE — Progress Notes (Deleted)
FOLLOW UP Date of Service/Encounter:  10/21/23  Subjective:  Wesley Baxter (DOB: [birth year]) is a 53 y.o. male who [facility]23 in re-evaluation of the following: *** History obtained from: chart review and {Persons; PED relatives w/patient:19415::"patient"}.  For Review, LV was on 04/18/23  with Dr.Lenoard Helbert seen for routine follow-up. See below for summary of history and diagnostics.   Therapeutic plans/changes recommended: rash still present, suspected AD, discussed dupixent as next step.  ----------------------------------------------------- Pertinent History/Diagnostics:  Rash:  September 2023 rash started. Initially told poison ivy contact dermatitis. Started on his calf and lasted weeks. Resolved and left hypopigemnted patch. Benadryl failed.  PCP ordered labs: alpha-gal + 1.02, beef 0.17 IgE elevated, but below + threshold. Lamb and pork negative. He cut out beef, but rash did not resolve.  However, he feels rash worsens after eating mammalian meat. However, continues eating beef and pork without other symptoms.  - TRUE patch testing + neomycin,  formaldehyde, imidazolidinyl urea -repeat labs 04/18/23-alpha gal 0.72, beef 0.19, pork and lamb; encouraged to eat meat as rash not suspected related to this and he has never had concerning symptoms of anaphylaxis. --------------------------------------------------- Today presents for follow-up. Discussed the use of AI scribe software for clinical note transcription with the patient, who gave verbal consent to proceed.  History of Present Illness             Chart Review: ***  All medications reviewed by clinical staff and updated in chart. No new pertinent medical or surgical history except as noted in HPI.  ROS: All others negative except as noted per HPI.   Objective:  There were no vitals taken for this visit. There is no height or weight on file to calculate BMI. Physical  Exam: General Appearance:  Alert, cooperative, no distress, appears stated age  Head:  Normocephalic, without obvious abnormality, atraumatic  Eyes:  Conjunctiva clear, EOM's intact  Ears {Blank multiple:19196:a:"***","EACs normal bilaterally","normal TMs bilaterally","ear tubes present bilaterally without exudate"}  Nose: Nares normal, {Blank multiple:19196:a:"***","hypertrophic turbinates","normal mucosa","no visible anterior polyps","septum midline"}  Throat: Lips, tongue normal; teeth and gums normal, {Blank multiple:19196:a:"***","normal posterior oropharynx","tonsils 2+","tonsils 3+","no tonsillar exudate","+ cobblestoning","surgically absent tonsils"}  Neck: Supple, symmetrical  Lungs:   {Blank multiple:19196:a:"***","clear to auscultation bilaterally","end-expiratory wheezing","wheezing throughout"}, Respirations unlabored, {Blank multiple:19196:a:"***","no coughing","intermittent dry coughing"}  Heart:  {Blank multiple:19196:a:"***","regular rate and rhythm","no murmur"}, Appears well perfused  Extremities: No edema  Skin: {Blank multiple:19196:a:"***","erythematous, dry patches scattered on ***","lichenification on ***","Skin color, texture, turgor normal","no rashes or lesions on visualized portions of skin"}  Neurologic: No gross deficits   Labs:  Lab Orders  No laboratory test(s) ordered today    Spirometry:  Tracings reviewed. His effort: {Blank single:19197::"Good reproducible efforts.","It was hard to get consistent efforts and there is a question as to whether this reflects a maximal maneuver.","Poor effort, data can not be interpreted.","Variable effort-results affected","effort okay for first attempt at spirometry.","Results not reproducible due to ***"} FVC: ***L FEV1: ***L, ***% predicted FEV1/FVC ratio: ***% Interpretation: {Blank single:19197::"Spirometry consistent with mild obstructive disease","Spirometry consistent with moderate obstructive disease","Spirometry  consistent with severe obstructive disease","Spirometry consistent with possible restrictive disease","Spirometry consistent with mixed obstructive and restrictive disease","Spirometry uninterpretable due to technique","Spirometry consistent with normal pattern","No overt abnormalities noted given today's efforts","Nonobstructive ratio, low FEV1","Nonobstructive ratio, low FEV1, possible restriction"}.  Please see scanned spirometry results for details.  Skin Testing: {Blank single:19197::"Select foods","Environmental allergy panel","Environmental allergy panel and select foods","Food allergy panel","None","Deferred due to recent antihistamines use","deferred due to recent reaction","Pediatric  Environmental Allergy Panel","Pediatric Food Panel","Select foods and environmental allergies"}. {Blank single:19197::"Adequate positive and negative controls","Inadequate positive control-testing invalid","Adequate positive and negative controls, dermatographism present, testing difficult to interpret"}. Results discussed with patient/family.   {Blank single:19197::"Allergy testing results were read and interpreted by myself, documented by clinical staff.","Allergy testing results were read by ***,FNP, documented by clinical staff"}  Assessment/Plan   ***  Other: {Blank multiple:19196:a:"***","samples provided of: ***","spacer provided in clinic","nebulizer machine provided in clinic","school forms provided","reviewed spirometry technique","reviewed inhaler technique","allergy injection given in clinic today","biologic given in clinic today"}  Tonny Bollman, MD  [facility]

## 2023-10-23 ENCOUNTER — Ambulatory Visit: Payer: 59 | Admitting: Internal Medicine

## 2024-09-16 ENCOUNTER — Other Ambulatory Visit: Payer: Self-pay | Admitting: Internal Medicine
